# Patient Record
Sex: Female | Born: 1945 | Race: White | Hispanic: Yes | Smoking: Never smoker
Health system: Southern US, Community
[De-identification: ages and names within clinical notes are randomized; demographics above are authoritative.]

## PROBLEM LIST (undated history)

## (undated) DIAGNOSIS — E119 Type 2 diabetes mellitus without complications: Secondary | ICD-10-CM

## (undated) DIAGNOSIS — E039 Hypothyroidism, unspecified: Secondary | ICD-10-CM

## (undated) DIAGNOSIS — E785 Hyperlipidemia, unspecified: Secondary | ICD-10-CM

## (undated) HISTORY — DX: Type 2 diabetes mellitus without complications: E11.9

## (undated) HISTORY — DX: Hyperlipidemia, unspecified: E78.5

## (undated) HISTORY — PX: CYST EXCISION: SHX5701

## (undated) HISTORY — DX: Hypothyroidism, unspecified: E03.9

---

## 2005-12-17 ENCOUNTER — Ambulatory Visit: Payer: Self-pay | Admitting: Family Medicine

## 2006-01-22 ENCOUNTER — Ambulatory Visit: Payer: Self-pay | Admitting: *Deleted

## 2006-02-18 ENCOUNTER — Ambulatory Visit: Payer: Self-pay | Admitting: Family Medicine

## 2006-05-21 ENCOUNTER — Ambulatory Visit: Payer: Self-pay | Admitting: Family Medicine

## 2006-06-02 ENCOUNTER — Ambulatory Visit (HOSPITAL_COMMUNITY): Admission: RE | Admit: 2006-06-02 | Discharge: 2006-06-02 | Payer: Self-pay | Admitting: Family Medicine

## 2006-08-10 ENCOUNTER — Ambulatory Visit: Payer: Self-pay | Admitting: Family Medicine

## 2006-08-10 ENCOUNTER — Encounter (INDEPENDENT_AMBULATORY_CARE_PROVIDER_SITE_OTHER): Payer: Self-pay | Admitting: Family Medicine

## 2007-02-09 ENCOUNTER — Encounter (INDEPENDENT_AMBULATORY_CARE_PROVIDER_SITE_OTHER): Payer: Self-pay | Admitting: *Deleted

## 2008-11-13 ENCOUNTER — Ambulatory Visit: Payer: Self-pay | Admitting: Family Medicine

## 2008-11-15 ENCOUNTER — Ambulatory Visit: Payer: Self-pay | Admitting: Family Medicine

## 2008-12-06 ENCOUNTER — Ambulatory Visit: Payer: Self-pay | Admitting: Family Medicine

## 2009-02-07 ENCOUNTER — Ambulatory Visit: Payer: Self-pay | Admitting: Family Medicine

## 2009-02-07 LAB — CONVERTED CEMR LAB
Cholesterol: 140 mg/dL (ref 0–200)
Free T4: 1.21 ng/dL (ref 0.80–1.80)
HDL: 22 mg/dL — ABNORMAL LOW (ref >39)
LDL Cholesterol: 66 mg/dL (ref 0–99)
Total CHOL/HDL Ratio: 6.4
Triglycerides: 260 mg/dL — ABNORMAL HIGH (ref <150)
VLDL: 52 mg/dL — ABNORMAL HIGH (ref 0–40)

## 2009-03-01 ENCOUNTER — Ambulatory Visit: Payer: Self-pay | Admitting: Family Medicine

## 2012-02-24 ENCOUNTER — Ambulatory Visit (INDEPENDENT_AMBULATORY_CARE_PROVIDER_SITE_OTHER): Payer: Self-pay | Admitting: Family Medicine

## 2012-02-24 ENCOUNTER — Encounter: Payer: Self-pay | Admitting: Family Medicine

## 2012-02-24 VITALS — BP 120/80 | HR 76 | Temp 97.9°F | Ht <= 58 in | Wt 147.0 lb

## 2012-02-24 DIAGNOSIS — E039 Hypothyroidism, unspecified: Secondary | ICD-10-CM

## 2012-02-24 DIAGNOSIS — E785 Hyperlipidemia, unspecified: Secondary | ICD-10-CM

## 2012-02-24 DIAGNOSIS — E119 Type 2 diabetes mellitus without complications: Secondary | ICD-10-CM

## 2012-02-24 MED ORDER — METFORMIN HCL 850 MG PO TABS: 850.0000 mg | ORAL_TABLET | Freq: Two times a day (BID) | ORAL | Status: DC

## 2012-02-24 MED ORDER — LEVOTHYROXINE SODIUM 25 MCG PO TABS: 25.0000 ug | ORAL_TABLET | Freq: Every day | ORAL | Status: DC

## 2012-02-24 MED ORDER — PRAVASTATIN SODIUM 20 MG PO TABS: 20.0000 mg | ORAL_TABLET | Freq: Every day | ORAL | Status: DC

## 2012-02-24 NOTE — Patient Instructions (Signed)
It was nice to meet you. Please leave your name and number at the front desk to get a call back about the [facility].  Please make an appointment to come back in 2-3 months for labs.  Thanks [employer]. Caasi Giglia, M.D.

## 2012-02-24 NOTE — Progress Notes (Signed)
Patient ID: Brittany Stout, female   DOB: 10-Apr-1946, 66 y.o.   MRN: [id number] [facility] Truxton Stupka M. Rad Gramling, MD Phone: [phone]   Subjective: HPI: Patient is a 66 y.o. female presenting to clinic today for new patient appointment. Previously seen at [facility]. Phone interpreter used throughout history and physical. Concerns today include medication refills  DM- Followed by [facility] previously. Had labs 2 months ago and everything looked good per patient. She takes Metformin 850mg  BID. Denies any highs or lows. Checks blood sugar only when she feels bad. Tolerates Metformin well.  HLD- Takes Pravastatin daily. Last labs ok. Does not follow a diet. She is overweight. No complications with medications.  Thyroid- Losing weight. Last TSH ok. Takes medications with no difficulty.   Past Medical History  Diagnosis Date  . Diabetes mellitus   . HLD (hyperlipidemia)   . Hypothyroid    No Past Surgeries History   Social History  . Marital Status: Legally Separated    Spouse Name: N/A    Number of Children: N/A  . Years of Education: N/A   Social History Main Topics  . Smoking status: Never Smoker   . Smokeless tobacco: Not on file  . Alcohol Use: No  . Drug Use: Not on file  . Sexually Active: Not on file   Other Topics Concern  . Not on file   Social History Narrative   Lives with daughter, son-in-law and grandsons. Works at a school as a Art therapist.    Health Maintenance: Last mammogram 2 years ago. Colonoscopy 6 years ago. Takes flu shot but not this year.  ROS: Please see HPI above.  Objective: Office vital signs reviewed.  Physical Examination:  General: Awake, alert. NAD HEENT: Atraumatic, normocephalic Neck: No masses palpated. No LAD, no palpable thyroid Pulm: CTAB, no wheezes Cardio: RRR, no murmurs appreciated Abdomen: Obese, +BS, soft, nontender, nondistended Extremities: No edema Neuro: Grossly  intact  Assessment: 66 yo F with PMH of hypothyroidism, DM and HLD presenting as a new patient  Plan: See Problem List and After Visit Summary

## 2012-02-25 ENCOUNTER — Encounter: Payer: Self-pay | Admitting: Family Medicine

## 2012-02-25 DIAGNOSIS — E785 Hyperlipidemia, unspecified: Secondary | ICD-10-CM | POA: Insufficient documentation

## 2012-02-25 DIAGNOSIS — E039 Hypothyroidism, unspecified: Secondary | ICD-10-CM | POA: Insufficient documentation

## 2012-02-25 DIAGNOSIS — E119 Type 2 diabetes mellitus without complications: Secondary | ICD-10-CM | POA: Insufficient documentation

## 2012-02-25 NOTE — Assessment & Plan Note (Signed)
Continue Pravastatin. Recheck fasting labs at next visit. Encourage diet. Awaiting labs from [facility].

## 2012-02-25 NOTE — Assessment & Plan Note (Signed)
Continue current dose. Daughter concerned about her weight loss. Will trend weights and recheck TSH at next visit, or await labs from [facility]. Patient agrees with this plan.

## 2012-02-25 NOTE — Assessment & Plan Note (Signed)
Continue Metformin 850mg  BID as previously prescribed. Will recheck labs at next visit including Bmet and A1C. Encouraged her to check CBG and keep log of blood sugars

## 2012-06-23 ENCOUNTER — Ambulatory Visit (INDEPENDENT_AMBULATORY_CARE_PROVIDER_SITE_OTHER): Payer: No Typology Code available for payment source | Admitting: Family Medicine

## 2012-06-23 ENCOUNTER — Telehealth: Payer: Self-pay | Admitting: Family Medicine

## 2012-06-23 ENCOUNTER — Encounter: Payer: Self-pay | Admitting: Family Medicine

## 2012-06-23 VITALS — BP 119/78 | HR 67 | Temp 98.9°F | Ht <= 58 in | Wt 148.0 lb

## 2012-06-23 DIAGNOSIS — E039 Hypothyroidism, unspecified: Secondary | ICD-10-CM

## 2012-06-23 DIAGNOSIS — Z1239 Encounter for other screening for malignant neoplasm of breast: Secondary | ICD-10-CM

## 2012-06-23 DIAGNOSIS — Z79899 Other long term (current) drug therapy: Secondary | ICD-10-CM

## 2012-06-23 DIAGNOSIS — E119 Type 2 diabetes mellitus without complications: Secondary | ICD-10-CM

## 2012-06-23 DIAGNOSIS — M79609 Pain in unspecified limb: Secondary | ICD-10-CM

## 2012-06-23 DIAGNOSIS — M79673 Pain in unspecified foot: Secondary | ICD-10-CM | POA: Insufficient documentation

## 2012-06-23 DIAGNOSIS — E785 Hyperlipidemia, unspecified: Secondary | ICD-10-CM

## 2012-06-23 LAB — TSH: TSH: 5.691 u[IU]/mL — ABNORMAL HIGH (ref 0.350–4.500)

## 2012-06-23 LAB — LIPID PANEL
Cholesterol: 155 mg/dL (ref 0–200)
Triglycerides: 187 mg/dL — ABNORMAL HIGH (ref <150)

## 2012-06-23 LAB — COMPREHENSIVE METABOLIC PANEL
AST: 19 U/L (ref 0–37)
Calcium: 9.5 mg/dL (ref 8.4–10.5)
Glucose, Bld: 124 mg/dL — ABNORMAL HIGH (ref 70–99)
Sodium: 140 mEq/L (ref 135–145)

## 2012-06-23 LAB — POCT URINALYSIS DIPSTICK
Glucose, UA: NEGATIVE
Ketones, UA: NEGATIVE
Nitrite, UA: POSITIVE
Protein, UA: NEGATIVE
Spec Grav, UA: 1.02
Urobilinogen, UA: 0.2

## 2012-06-23 LAB — CBC
Hemoglobin: 14.6 g/dL (ref 12.0–15.0)
Platelets: 309 10*3/uL (ref 150–400)
RBC: 4.88 MIL/uL (ref 3.87–5.11)
RDW: 13.9 % (ref 11.5–15.5)

## 2012-06-23 LAB — POCT UA - MICROSCOPIC ONLY

## 2012-06-23 LAB — POCT UA - MICROALBUMIN: Creatinine, POC: 100 mg/dL

## 2012-06-23 LAB — POCT GLYCOSYLATED HEMOGLOBIN (HGB A1C): Hemoglobin A1C: 6.2

## 2012-06-23 MED ORDER — NAPROXEN 500 MG PO TABS: 500.0000 mg | ORAL_TABLET | Freq: Two times a day (BID) | ORAL | Status: DC

## 2012-06-23 MED ORDER — METFORMIN HCL 850 MG PO TABS: 850.0000 mg | ORAL_TABLET | Freq: Two times a day (BID) | ORAL | Status: DC

## 2012-06-23 MED ORDER — LEVOTHYROXINE SODIUM 25 MCG PO TABS: 25.0000 ug | ORAL_TABLET | Freq: Every day | ORAL | Status: DC

## 2012-06-23 MED ORDER — PRAVASTATIN SODIUM 20 MG PO TABS: 20.0000 mg | ORAL_TABLET | Freq: Every day | ORAL | Status: DC

## 2012-06-23 NOTE — Addendum Note (Signed)
Addended by: Hilarie Fredrickson on: 06/23/2012 11:52 AM   Modules accepted: Orders

## 2012-06-23 NOTE — Progress Notes (Signed)
Patient ID: Brittany Stout, female   DOB: 02-20-1946, 67 y.o.   MRN: [id number]  [facility] Daishawn Lauf M. Jazzman Loughmiller, MD Phone: [phone]   Subjective: HPI: Patient is a 67 y.o. female presenting to clinic today for follow up appointment. [employer] Jean Rosenthal present as interpreter throughout entire office visit. Concerns today include right foot pain.  1. Diabetes:  Readings at home: Only checks when she feels bad, 90-120 fasting. No symptomatic lows Taking medications: Metformin 850mg  BID but currently out of Rx Side effects: None ROS: denies fever, chills, dizziness, LOC, polyuria, polydipsia, numbness or tingling in extremities or chest pain. Eye exam: Long time ago  Foot exam: 3 months ago HgA1c: Pending today Microalbumin: Unsure of last exam  2. HLD: Medications: Pravastatin  Diet: Not eating much fried or fatty foods. Weight stable RUQ pain: None Muscle aches: None Last labs: Due today; currently fasting  3. Hypothyroidism: Any symptoms?: Overall feeling well Taking medications: Yes Synthroid Side effects: None Last TSH: Due today  4. Foot pain- Started in knees a long time ago, and now radiating down to the foot. Now both sides for last 6 months. On bottom of feet. When cold weather comes it make the pain worse. Doesn't take anything for pain. Feet hurt most at night time. Now wearing Nike tennis shoes because other shoes made her feel unstable. These shoes are much better.   History Reviewed: Non-smoker.  ROS: Please see HPI above.  Objective: Office vital signs reviewed. There were no vitals taken for this visit.  Physical Examination:  General: Awake, alert. NAD. Very pleasant HEENT: Atraumatic, normocephalic. MMM Pulm: CTAB, no wheezes Cardio: RRR, no murmurs appreciated Abdomen:obese, +BS, soft, nontender, nondistended Extremities: No edema. No significant TTP of knees bilaterally. + metatarsal squeeze on right. No TTP of metatarsals or  plantar fascia b/l Neuro: Grossly intact  Assessment: 67 y.o. female follow up appointment  Plan: See Problem List and After Visit Summary

## 2012-06-23 NOTE — Assessment & Plan Note (Signed)
Could be arthritic changes vs. Plantar fascitis. Will give Naproxen daily for pain. Continue to wear well cushioned shoes and f/u in 2-3 months.

## 2012-06-23 NOTE — Assessment & Plan Note (Signed)
No new symptoms. Will check TSH today and let pt know if needs to have dose changed.

## 2012-06-23 NOTE — Telephone Encounter (Signed)
Pt's UA from today shows that she has a urinary tract infection. Would you mind calling her to ask if she has any symptoms from this? Also, since she has so much bacteria in her urine it would be best for her to start an antibiotic. If she does not have any allergies, I will call in Keflex for her to take for 10 days.  Thank you! Amber M. Hairford, M.D.

## 2012-06-23 NOTE — Assessment & Plan Note (Signed)
Fasting today. Labs done. Will continue Pravastatin. Given written Rx to [facility] to [facility] to see if that would be less expensive.

## 2012-06-23 NOTE — Addendum Note (Signed)
Addended by: [place], Esley Brooking on: 06/23/2012 12:11 PM   Modules accepted: Orders

## 2012-06-23 NOTE — Patient Instructions (Signed)
We will check your labs today. We will call you if anything is abnormal, otherwise I will send you a letter.  I am prescribing an anti-inflammatory. You can take it daily for pain. Continue wearing soft shoes.  Please schedule your mammogram.  I will see you in 3 months for a follow up on your pain.   Brittany Stout M. Kristie Bracewell, M.D.

## 2012-06-23 NOTE — Assessment & Plan Note (Signed)
A1C 6.2 today. Continue Metformin daily. Will check microalbumin today, and also place Opthalmology referral although there may be a significant waitlist for this.

## 2012-06-24 MED ORDER — LEVOTHYROXINE SODIUM 50 MCG PO TABS: 50.0000 ug | ORAL_TABLET | Freq: Every day | ORAL | Status: DC

## 2012-06-24 MED ORDER — CEPHALEXIN 500 MG PO CAPS: 500.0000 mg | ORAL_CAPSULE | Freq: Two times a day (BID) | ORAL | Status: DC

## 2012-06-24 MED ORDER — LEVOTHYROXINE SODIUM 50 MCG PO TABS: 25.0000 ug | ORAL_TABLET | Freq: Every day | ORAL | Status: DC

## 2012-06-24 NOTE — Telephone Encounter (Signed)
Synthroid po daily, #90 and Keflex 500mg  po BID x 10 days #20/0R have been added to orders. Thank you

## 2012-06-24 NOTE — Telephone Encounter (Signed)
Paged Dr. Mikel Cella to make sure of dose

## 2012-06-24 NOTE — Telephone Encounter (Signed)
I called and left message on voicmail at [facility] pharmacy early this afternon

## 2012-06-24 NOTE — Telephone Encounter (Signed)
I have received other lab work for Ms. Scholten. Her TSH was high, so we should increase her Synthroid dose. [facility] If she does not want to have it filled there, I can send to [facility] instead so she does not have to come back to get a new Rx. She should NOT fill the Synthroid Rx she was given yesterday.  Also, encourage her to exercise. Her good cholesterol was too low.  If she has any questions, please let me know.  Thank you! Amber M. Hairford, M.D.

## 2012-07-01 ENCOUNTER — Telehealth: Payer: Self-pay | Admitting: *Deleted

## 2012-07-01 NOTE — Telephone Encounter (Signed)
Received phone call from Diane at [facility] and she advises  That patient never picked up Keflex  from [facility]. M ssage left on voicemail for patient to please call our office.

## 2012-07-04 ENCOUNTER — Telehealth: Payer: Self-pay | Admitting: Family Medicine

## 2012-07-04 NOTE — Telephone Encounter (Signed)
Pt called [place] back after [facility] call Pt. Pt stated that she went for med @ [facility] last week.  MJ

## 2012-07-04 NOTE — Telephone Encounter (Signed)
Marines states daughter stated she did pick up RX . Patient is now in [place].

## 2012-07-04 NOTE — Telephone Encounter (Signed)
Marines Jean Rosenthal [place] [employer] on [address] and stated the pharmacy was mistaken . She did pick up RX last week. Patient is now in [place].

## 2012-12-07 ENCOUNTER — Ambulatory Visit (HOSPITAL_COMMUNITY): Payer: Self-pay | Attending: Family Medicine

## 2013-04-28 ENCOUNTER — Telehealth: Payer: Self-pay | Admitting: *Deleted

## 2013-04-28 NOTE — Telephone Encounter (Signed)
Patient coming in on 05/01/13 for LDL.Brittany Stout, Brittany Stout

## 2013-06-26 ENCOUNTER — Telehealth: Payer: Self-pay | Admitting: *Deleted

## 2013-06-26 DIAGNOSIS — E119 Type 2 diabetes mellitus without complications: Secondary | ICD-10-CM

## 2013-06-26 NOTE — Telephone Encounter (Signed)
Pt needs an appt for a LDL.  Can you call her and help her schedule this lab appt?  Thanks Limited BrandsJazmin Hartsell,CMA

## 2013-07-03 NOTE — Telephone Encounter (Signed)
Spoke with daughter, Pt is not longer in [place].  [place]

## 2013-07-03 NOTE — Telephone Encounter (Signed)
[facility] for [facility]. [place], Maryjo RochesterJessica Dawn

## 2013-07-03 NOTE — Telephone Encounter (Signed)
Removed patient from [facility] patient panel.

## 2013-12-22 ENCOUNTER — Encounter: Payer: Self-pay | Admitting: Family Medicine

## 2013-12-22 ENCOUNTER — Ambulatory Visit (INDEPENDENT_AMBULATORY_CARE_PROVIDER_SITE_OTHER): Payer: Medicaid Other | Admitting: Family Medicine

## 2013-12-22 VITALS — BP 111/72 | HR 71 | Temp 98.5°F | Wt 147.0 lb

## 2013-12-22 DIAGNOSIS — E785 Hyperlipidemia, unspecified: Secondary | ICD-10-CM

## 2013-12-22 DIAGNOSIS — E119 Type 2 diabetes mellitus without complications: Secondary | ICD-10-CM

## 2013-12-22 DIAGNOSIS — Z0000 Encounter for general adult medical examination without abnormal findings: Secondary | ICD-10-CM

## 2013-12-22 DIAGNOSIS — E039 Hypothyroidism, unspecified: Secondary | ICD-10-CM

## 2013-12-22 LAB — COMPREHENSIVE METABOLIC PANEL
ALK PHOS: 63 U/L (ref 39–117)
ALT: 19 U/L (ref 0–35)
AST: 21 U/L (ref 0–37)
Albumin: 4.3 g/dL (ref 3.5–5.2)
BUN: 18 mg/dL (ref 6–23)
CHLORIDE: 106 meq/L (ref 96–112)
CO2: 22 meq/L (ref 19–32)
Calcium: 9.4 mg/dL (ref 8.4–10.5)
Creat: 0.83 mg/dL (ref 0.50–1.10)
GLUCOSE: 113 mg/dL — AB (ref 70–99)
Potassium: 4.2 mEq/L (ref 3.5–5.3)
Sodium: 140 mEq/L (ref 135–145)
Total Bilirubin: 0.6 mg/dL (ref 0.2–1.2)
Total Protein: 6.9 g/dL (ref 6.0–8.3)

## 2013-12-22 LAB — LIPID PANEL
CHOLESTEROL: 161 mg/dL (ref 0–200)
HDL: 18 mg/dL — ABNORMAL LOW (ref >39)
TRIGLYCERIDES: 548 mg/dL — AB (ref <150)
Total CHOL/HDL Ratio: 8.9 Ratio

## 2013-12-22 LAB — TSH: TSH: 5.094 u[IU]/mL — ABNORMAL HIGH (ref 0.350–4.500)

## 2013-12-22 LAB — POCT GLYCOSYLATED HEMOGLOBIN (HGB A1C): Hemoglobin A1C: 5.9

## 2013-12-22 MED ORDER — GABAPENTIN 300 MG PO CAPS: ORAL_CAPSULE | ORAL | Status: DC

## 2013-12-22 MED ORDER — METFORMIN HCL 850 MG PO TABS: 850.0000 mg | ORAL_TABLET | Freq: Two times a day (BID) | ORAL | Status: DC

## 2013-12-22 MED ORDER — PRAVASTATIN SODIUM 20 MG PO TABS: 20.0000 mg | ORAL_TABLET | Freq: Every day | ORAL | Status: DC

## 2013-12-22 NOTE — Patient Instructions (Signed)
Please return in 2 to 3 weeks.

## 2013-12-22 NOTE — Assessment & Plan Note (Signed)
TSH checked today.  Not currently on medication, but consider starting medication pending results.

## 2013-12-22 NOTE — Progress Notes (Signed)
Subjective:     Patient ID: Brittany MunroEsther Stout, female   DOB: 03/16/1946, 68 y.o.   MRN: [id number]  HPI Brittany Stout is a 68yo female with a history of DM, hypothyroidism, and hyperlipidemia who presents today for a wellness visit.  Her last visit was on 06/23/2012. An interpretor was used throughout the visit.  She is not taking any medications except for Metformin 850mg  BID.  She has recently returned from [place] and a pharmacy there had been giving her her prescription.  Not taking any medications for any other conditions.  She does complain of a pain in her feet bilaterally, worse on the lateral aspect of her right foot.  Pain is described as burning, throbbing, feeling like an open wound.  She has had the pain for 2years, but it has began to get significantly worse starting 3 months ago.  Has also noted blurred vision, stating she can't cut her toenails anymore because she can't see her toenails.  She would like a referral to an ophthalmologist today.  Patient has history of weakness on the left side of her body, with the latest episode occuring 3months ago and then resolving. No other complaints today.     Review of Systems  HENT: Positive for hearing loss. Negative for ear pain.   Eyes: Positive for visual disturbance. Negative for pain.  Respiratory: Negative for shortness of breath.   Cardiovascular: Negative for chest pain.  Neurological: Positive for numbness. Negative for facial asymmetry and speech difficulty.       Objective:   Physical Exam  Constitutional: She appears well-developed and well-nourished. No distress.  HENT:  Head: Normocephalic and atraumatic.  Right Ear: External ear normal.  Left Ear: External ear normal.  Eyes: Conjunctivae and EOM are normal. Pupils are equal, round, and reactive to light. Right eye exhibits no discharge. Left eye exhibits no discharge. No scleral icterus.  Pterygium noted in right eye with extension into pupil  Cardiovascular: Normal rate,  regular rhythm and normal heart sounds.  Exam reveals no gallop and no friction rub.   No murmur heard. Pulmonary/Chest: Effort normal and breath sounds normal. No respiratory distress. She has no wheezes. She has no rales.  Musculoskeletal: Normal range of motion. She exhibits no edema and no tenderness.  Muscle Strength 5/5 bilaterally for upper and lower extremities  Neurological: She is alert. She has normal strength. No cranial nerve deficit. Coordination normal.  Decreased sensation over lateral right foot.  Otherwise, sensation intact in upper and lower extremities.   Diabetes Foot Exam:  Sensation felt at all points, however decreased sensitivity of right foot compared to left.     Assessment:     Please see Problem List for Assessment.      Plan:     Please see Problem List for Plan.

## 2013-12-22 NOTE — Assessment & Plan Note (Signed)
A1C 5.9 today, down from 6.2 in January 2014.  Prescription for Metformin 850mg  BID given.  Prescription of Pravastatin 20mg  given.  Prescription of Gabapentin 300mg  given and patient instructed to take one tablet the first day, one tablet every 12 hours the second day, one tablet every 8 hours the third day, and then continue to take TID.  Patients daughter voiced understanding of plan.  Referral placed for ophthalmology due to symptoms of diabetic retinopathy.  Advised to follow up in 2-3 weeks to recheck neuropathy.

## 2013-12-22 NOTE — Assessment & Plan Note (Signed)
Lipid Panel checked today.  Prescription given for Pravastatin 20mg .

## 2013-12-26 ENCOUNTER — Telehealth: Payer: Self-pay | Admitting: *Deleted

## 2013-12-26 ENCOUNTER — Encounter: Payer: Self-pay | Admitting: *Deleted

## 2013-12-26 NOTE — Telephone Encounter (Signed)
Message copied by Tanna SavoyPROPOSITO, Jaida Basurto S on Tue Dec 26, 2013 10:44 AM ------      Message from: Araceli BoucheUMLEY, Albers N      Created: Mon Dec 25, 2013  5:17 PM       Please call and make sure she schedules a follow-up appointment.  At her last visit on 12/22/13, I asked her to schedule one for 2-3weeks to recheck her neuropathy.  I didn't see an appointment scheduled yet, but she had some lab work that came back this weekend and I'd like to make sure she has one scheduled so I go over the results with with her.            Thanks for your help!      Rumley  ------

## 2013-12-26 NOTE — Telephone Encounter (Signed)
Left message on voicemail. Keijuan Schellhase S  

## 2014-02-23 ENCOUNTER — Encounter: Payer: Self-pay | Admitting: Family Medicine

## 2014-02-23 ENCOUNTER — Ambulatory Visit (INDEPENDENT_AMBULATORY_CARE_PROVIDER_SITE_OTHER): Payer: Medicaid Other | Admitting: Family Medicine

## 2014-02-23 VITALS — BP 122/77 | HR 69 | Temp 98.3°F | Wt 147.0 lb

## 2014-02-23 DIAGNOSIS — E785 Hyperlipidemia, unspecified: Secondary | ICD-10-CM

## 2014-02-23 DIAGNOSIS — E1142 Type 2 diabetes mellitus with diabetic polyneuropathy: Secondary | ICD-10-CM

## 2014-02-23 DIAGNOSIS — Z23 Encounter for immunization: Secondary | ICD-10-CM | POA: Diagnosis not present

## 2014-02-23 MED ORDER — PRAVASTATIN SODIUM 20 MG PO TABS: 20.0000 mg | ORAL_TABLET | Freq: Every day | ORAL | Status: DC

## 2014-02-23 MED ORDER — GABAPENTIN 300 MG PO CAPS
600.0000 mg | ORAL_CAPSULE | Freq: Three times a day (TID) | ORAL | Status: DC
Start: 2014-02-23 — End: 2017-03-03

## 2014-02-23 NOTE — Patient Instructions (Signed)
Thank you so much for coming to visit us today!  Please increase Gabapentin to two tablets 3 times a day. I have sent in a refill for pravastatin.  Thanks, Dr. Caroleen Hammanumley

## 2014-02-24 NOTE — Assessment & Plan Note (Signed)
-   Last A1C 5.9 at last visit - Increased dose of Gabapentin to 600mg  TID - Instructed to follow up as needed for pain.  - Return in three months to recheck A1C. Consider titrating off of medication if A1C continues to be well controlled

## 2014-02-24 NOTE — Assessment & Plan Note (Signed)
-   Refill of Pravastatin sent to pharmacy - Lipid panel at last visit showed Cholesterol of 161, TG of 548, HDL of 18 - ASCVD risk of 16.1%13.9%, however important to note calculation only studied for ages up to 67yo - Discussed importance of taking statin medication

## 2014-02-24 NOTE — Progress Notes (Signed)
Subjective:     Patient ID: Brittany Stout, female   DOB: 03/16/1946, 68 y.o.   MRN: [id number]  HPI Brittany Stout is a 68yo female presenting today to follow up her diabetic neuropathy.  Visit occurred with help of Spanish translator. # Diabetic Neuropathy - States pain is much improved and is now limited to right foot. - Gabapentin helps with pain and improves pain in right foot.  -Takes Gabapentin 300mg  TID - Describes pain in foot as feeling "loose" - States she is taking her diabetes medication  - Does not check her blood sugars - No refills of medications needed at this visit. - States she was first diagnosed with Diabetes thirteen years ago.  #Hyperlipidemia - States she has not been taking her Pravastatin because she ran out of refills  No other concerns today.   Review of Systems  All other systems reviewed and are negative.      Objective:   Physical Exam  Constitutional: She appears well-developed and well-nourished. No distress.  Cardiovascular: Normal rate, regular rhythm, normal heart sounds and intact distal pulses.  Exam reveals no gallop and no friction rub.   No murmur heard. Pulmonary/Chest: Effort normal and breath sounds normal. No respiratory distress. She has no wheezes. She has no rales.  Abdominal: Soft. Bowel sounds are normal. She exhibits no distension and no mass. There is no tenderness. There is no rebound and no guarding.  Neurological:  No decreased sensation noted in feet.  Skin: Skin is warm and dry. No rash noted. She is not diaphoretic. No erythema.  No sores or ulcers noted on foot exam  Psychiatric: She has a normal mood and affect. Her behavior is normal.       Assessment:     Please see Problem List for Assessment.     Plan:     Please see Problem List for Plan.

## 2014-03-26 ENCOUNTER — Encounter: Payer: Self-pay | Admitting: Family Medicine

## 2015-01-01 ENCOUNTER — Ambulatory Visit: Payer: Medicaid Other | Admitting: Family Medicine

## 2015-02-15 ENCOUNTER — Other Ambulatory Visit: Payer: Self-pay | Admitting: Internal Medicine

## 2015-02-15 DIAGNOSIS — E2839 Other primary ovarian failure: Secondary | ICD-10-CM

## 2015-03-12 ENCOUNTER — Other Ambulatory Visit: Payer: Self-pay

## 2015-03-12 DIAGNOSIS — Z1231 Encounter for screening mammogram for malignant neoplasm of breast: Secondary | ICD-10-CM

## 2015-03-29 ENCOUNTER — Ambulatory Visit
Admission: RE | Admit: 2015-03-29 | Discharge: 2015-03-29 | Disposition: A | Discharge status: Home / Self Care | Payer: Medicaid Other | Source: Ambulatory Visit

## 2015-03-29 DIAGNOSIS — Z1231 Encounter for screening mammogram for malignant neoplasm of breast: Secondary | ICD-10-CM

## 2015-04-09 ENCOUNTER — Other Ambulatory Visit: Payer: Self-pay | Admitting: Internal Medicine

## 2015-04-09 DIAGNOSIS — R928 Other abnormal and inconclusive findings on diagnostic imaging of breast: Secondary | ICD-10-CM

## 2016-01-09 NOTE — Progress Notes (Signed)
Subjective:     Patient ID: Brittany Stout, female   DOB: 1945-06-12, 70 y.o.   MRN: [id number]  HPI Mrs. Brittany Stout is a 70yo female presenting today for diabetes follow up.Visit conducted with the aid of Spanish interpreter. - Last office visit 02/2014. Was taking Metformin 850mg  twice daily. A1C 5.9 in 11/2013. -Reports she is still taking metformin 850 mg twice daily. -Denies any numbness in feet, blurred vision -Main concern today is with occasional whole-body pain. Reports that pain is literally over entire body, encompassing her bilateral arms, legs, back, abdomen, head. Pain is not currently present. -When pain is present last for 2-3 days; this occurs several times per week. -Does not take medication for pain -Last CMP in July 2015. Last thyroid check July 2015. -Nonsmoker  Review of Systems Per HPI. Other systems negative.    Objective:   Physical Exam  Constitutional: She appears well-developed and well-nourished. No distress.  Cardiovascular: Normal rate and regular rhythm.   No murmur heard. Pulmonary/Chest: Effort normal. No respiratory distress. She has no wheezes.  Abdominal: Soft. She exhibits no distension. There is no tenderness.  Neurological:  Sensation intact over feet bilaterally  Skin: No rash noted.  No callouses noted.  Psychiatric: She has a normal mood and affect. Her behavior is normal.      Assessment and Plan:     DM (diabetes mellitus) -A1c increased to 6.4 today, up from 5.9 at last check in 2015. -Will increase metformin to 1000 mg twice daily dosing. May complete 850 mg tablets. -Will check CMP today. -Return in 3 months to recheck A1c.  Hypothyroidism -Currently on levothyroxine. -Will check TSH   Diffuse pain -Unsure of etiology since she is not currently in pain to examine. History of full-body pain including every part of her body have normal. -Will prescribe naproxen 500 mg twice a day as needed for pain -Will check CMP

## 2016-01-10 ENCOUNTER — Encounter: Payer: Self-pay | Admitting: Family Medicine

## 2016-01-10 ENCOUNTER — Ambulatory Visit (INDEPENDENT_AMBULATORY_CARE_PROVIDER_SITE_OTHER): Payer: Medicaid Other | Admitting: Family Medicine

## 2016-01-10 VITALS — BP 103/70 | HR 82 | Temp 97.7°F | Ht <= 58 in | Wt 147.6 lb

## 2016-01-10 DIAGNOSIS — R69 Illness, unspecified: Secondary | ICD-10-CM

## 2016-01-10 DIAGNOSIS — E1142 Type 2 diabetes mellitus with diabetic polyneuropathy: Secondary | ICD-10-CM | POA: Diagnosis not present

## 2016-01-10 DIAGNOSIS — R52 Pain, unspecified: Secondary | ICD-10-CM

## 2016-01-10 LAB — POCT GLYCOSYLATED HEMOGLOBIN (HGB A1C): Hemoglobin A1C: 6.4

## 2016-01-10 MED ORDER — METFORMIN HCL 1000 MG PO TABS: 1000.0000 mg | ORAL_TABLET | Freq: Two times a day (BID) | ORAL | 3 refills | Status: DC

## 2016-01-10 MED ORDER — NAPROXEN 500 MG PO TABS: 500.0000 mg | ORAL_TABLET | Freq: Two times a day (BID) | ORAL | 2 refills | Status: DC | PRN

## 2016-01-10 NOTE — Patient Instructions (Addendum)
Thank you so much for coming to visit today! - We will check several labs today. We will mail you a letter with the results. - Please increase your Metformin to 1000mg . You may complete the bottle of Metformin you currently have prior to starting. - I have given you a prescription for Naproxen to take twice a day as needed for pain.  Dr. Caroleen Hammanumley

## 2016-01-10 NOTE — Assessment & Plan Note (Signed)
-  Currently on levothyroxine. -Will check TSH

## 2016-01-10 NOTE — Assessment & Plan Note (Addendum)
-  A1c increased to 6.4 today, up from 5.9 at last check in 2015. -Will increase metformin to 1000 mg twice daily dosing. May complete 850 mg tablets. -Will check CMP today. -Return in 3 months to recheck A1c.

## 2016-01-11 LAB — COMPLETE METABOLIC PANEL WITH GFR
ALBUMIN: 4.2 g/dL (ref 3.6–5.1)
ALK PHOS: 56 U/L (ref 33–130)
ALT: 24 U/L (ref 6–29)
AST: 28 U/L (ref 10–35)
BILIRUBIN TOTAL: 0.5 mg/dL (ref 0.2–1.2)
BUN: 11 mg/dL (ref 7–25)
CO2: 22 mmol/L (ref 20–31)
Calcium: 9.4 mg/dL (ref 8.6–10.4)
Chloride: 104 mmol/L (ref 98–110)
Creat: 0.76 mg/dL (ref 0.50–0.99)
GFR, EST NON AFRICAN AMERICAN: 81 mL/min (ref >=60)
GLUCOSE: 88 mg/dL (ref 65–99)
POTASSIUM: 4.1 mmol/L (ref 3.5–5.3)
SODIUM: 139 mmol/L (ref 135–146)
Total Protein: 7.1 g/dL (ref 6.1–8.1)

## 2016-01-11 LAB — TSH: TSH: 2.95 mIU/L

## 2016-01-13 ENCOUNTER — Encounter: Payer: Self-pay | Admitting: Family Medicine

## 2016-01-29 ENCOUNTER — Other Ambulatory Visit: Payer: Self-pay | Admitting: Family Medicine

## 2016-01-29 DIAGNOSIS — Z1231 Encounter for screening mammogram for malignant neoplasm of breast: Secondary | ICD-10-CM

## 2016-04-07 ENCOUNTER — Ambulatory Visit
Admission: RE | Admit: 2016-04-07 | Discharge: 2016-04-07 | Disposition: A | Discharge status: Home / Self Care | Payer: Medicaid Other | Source: Ambulatory Visit | Attending: Family Medicine | Admitting: Family Medicine

## 2016-04-07 DIAGNOSIS — Z1231 Encounter for screening mammogram for malignant neoplasm of breast: Secondary | ICD-10-CM

## 2016-04-29 ENCOUNTER — Ambulatory Visit (INDEPENDENT_AMBULATORY_CARE_PROVIDER_SITE_OTHER): Payer: Medicaid Other | Admitting: Family Medicine

## 2016-04-29 ENCOUNTER — Encounter: Payer: Self-pay | Admitting: Family Medicine

## 2016-04-29 VITALS — BP 110/72 | HR 73 | Temp 98.3°F | Ht <= 58 in | Wt 152.6 lb

## 2016-04-29 DIAGNOSIS — R413 Other amnesia: Secondary | ICD-10-CM | POA: Diagnosis not present

## 2016-04-29 DIAGNOSIS — E1142 Type 2 diabetes mellitus with diabetic polyneuropathy: Secondary | ICD-10-CM | POA: Diagnosis present

## 2016-04-29 LAB — CBC
HEMATOCRIT: 41 % (ref 35.0–45.0)
HEMOGLOBIN: 13.6 g/dL (ref 11.7–15.5)
MCH: 29.6 pg (ref 27.0–33.0)
MCHC: 33.2 g/dL (ref 32.0–36.0)
MCV: 89.1 fL (ref 80.0–100.0)
MPV: 10.8 fL (ref 7.5–12.5)
Platelets: 283 10*3/uL (ref 140–400)
RBC: 4.6 MIL/uL (ref 3.80–5.10)
RDW: 13.7 % (ref 11.0–15.0)
WBC: 8.4 10*3/uL (ref 3.8–10.8)

## 2016-04-29 LAB — BASIC METABOLIC PANEL
BUN: 15 mg/dL (ref 7–25)
CALCIUM: 9.1 mg/dL (ref 8.6–10.4)
CO2: 22 mmol/L (ref 20–31)
Chloride: 104 mmol/L (ref 98–110)
Creat: 0.7 mg/dL (ref 0.50–0.99)
GLUCOSE: 84 mg/dL (ref 65–99)
Potassium: 4.1 mmol/L (ref 3.5–5.3)
SODIUM: 140 mmol/L (ref 135–146)

## 2016-04-29 LAB — LIPID PANEL
CHOL/HDL RATIO: 10.5 ratio — AB (ref <5.0)
CHOLESTEROL: 178 mg/dL (ref <200)
HDL: 17 mg/dL — ABNORMAL LOW (ref >50)
TRIGLYCERIDES: 446 mg/dL — AB (ref <150)

## 2016-04-29 LAB — VITAMIN B12: VITAMIN B 12: 281 pg/mL (ref 200–1100)

## 2016-04-29 LAB — TSH: TSH: 5.1 m[IU]/L — AB

## 2016-04-29 LAB — POCT GLYCOSYLATED HEMOGLOBIN (HGB A1C): HEMOGLOBIN A1C: 6

## 2016-04-29 NOTE — Patient Instructions (Signed)
Thank you so much for coming to visit today! We will check some labs. Please return if memory loss worsens. Please return in 6 months to recheck your A1C. Keep taking your Metformin. It might help to set an alarm on your phone.  Dr. Caroleen Hammanumley

## 2016-04-30 NOTE — Progress Notes (Signed)
Subjective:     Patient ID: Brittany Stout, female   DOB: [birth year], 70 y.o.   MRN: [id number]  HPI Brittany Stout Is a 70 year old female presenting today for diabetes management. Visit conducted with the aid of Spanish interpreter.  Reports she is still taking metformin 1000 mg twice a day. Does not check blood sugars. Last A1c 6.4 in August 2017. Reports well controlled diet. Reports she does a lot of walking.  Also of note, she is concerned about worsening memory. Reports she occasionally forgets to take her medicines and forgets where she puts her shoes/sweaters/etc. Does not notice any limitations to function to to memory loss. Family reports they did not notice any difference in her memory or behavior.  Nonsmoker  Review of Systems Per HPI     Objective:   Physical Exam  Constitutional: She is oriented to person, place, and time. She appears well-developed and well-nourished. No distress.  HENT:  Head: Normocephalic and atraumatic.  Eyes: EOM are normal. Pupils are equal, round, and reactive to light.  Neck: Normal range of motion. No thyromegaly present.  Cardiovascular: Normal rate and regular rhythm.   No murmur heard. Pulses intact  Pulmonary/Chest: Effort normal. No respiratory distress. She has no wheezes.  Abdominal: Soft. She exhibits no distension. There is no tenderness.  Musculoskeletal: She exhibits no edema.  Muscle strength 5 out of 5 in upper and lower extremities  Lymphadenopathy:    She has no cervical adenopathy.  Neurological: She is alert and oriented to person, place, and time. She has normal reflexes. No cranial nerve deficit.  Sensation intact. Short-term memory intact. Unable to complete clock draw, which is baseline per daughter.  Skin: No rash noted.  Psychiatric: She has a normal mood and affect. Her behavior is normal.      Assessment and Plan:     DM (diabetes mellitus) A1c 6 today. Continue metformin 1000 g twice a day. Follow-up in 6  months.  Memory Loss Neurological exam normal. Does not seem to be interfering with daily function. Discussed the effects sleep, diet, and workload has on memory. Will check BMP, CBC, TSH, vitamin B12. Follow-up if worsens.

## 2016-04-30 NOTE — Assessment & Plan Note (Signed)
A1c 6 today. Continue metformin 1000 g twice a day. Follow-up in 6 months.

## 2016-05-06 ENCOUNTER — Ambulatory Visit (HOSPITAL_COMMUNITY)
Admission: EM | Admit: 2016-05-06 | Discharge: 2016-05-06 | Disposition: A | Discharge status: Home / Self Care | Payer: Medicaid Other | Attending: Family Medicine | Admitting: Family Medicine

## 2016-05-06 ENCOUNTER — Ambulatory Visit: Payer: Medicaid Other | Admitting: Family Medicine

## 2016-05-06 ENCOUNTER — Encounter (HOSPITAL_COMMUNITY): Payer: Self-pay | Admitting: Emergency Medicine

## 2016-05-06 DIAGNOSIS — N390 Urinary tract infection, site not specified: Secondary | ICD-10-CM | POA: Diagnosis not present

## 2016-05-06 DIAGNOSIS — R1084 Generalized abdominal pain: Secondary | ICD-10-CM

## 2016-05-06 DIAGNOSIS — R319 Hematuria, unspecified: Secondary | ICD-10-CM

## 2016-05-06 DIAGNOSIS — R110 Nausea: Secondary | ICD-10-CM

## 2016-05-06 LAB — GLUCOSE, CAPILLARY: Glucose-Capillary: 90 mg/dL (ref 65–99)

## 2016-05-06 LAB — POCT URINALYSIS DIP (DEVICE)
BILIRUBIN URINE: NEGATIVE
GLUCOSE, UA: NEGATIVE mg/dL
Ketones, ur: NEGATIVE mg/dL
NITRITE: NEGATIVE
Protein, ur: NEGATIVE mg/dL
Specific Gravity, Urine: 1.01 (ref 1.005–1.030)
UROBILINOGEN UA: 0.2 mg/dL (ref 0.0–1.0)
pH: 7 (ref 5.0–8.0)

## 2016-05-06 MED ORDER — NITROFURANTOIN MONOHYD MACRO 100 MG PO CAPS: 100.0000 mg | ORAL_CAPSULE | Freq: Two times a day (BID) | ORAL | 0 refills | Status: DC

## 2016-05-06 MED ORDER — NITROFURANTOIN MONOHYD MACRO 100 MG PO CAPS
100.0000 mg | ORAL_CAPSULE | Freq: Two times a day (BID) | ORAL | 0 refills | Status: DC
Start: 2016-05-06 — End: 2016-05-06

## 2016-05-06 MED ORDER — ONDANSETRON 4 MG PO TBDP: 4.0000 mg | ORAL_TABLET | Freq: Three times a day (TID) | ORAL | 0 refills | Status: DC | PRN

## 2016-05-06 NOTE — ED Provider Notes (Signed)
CSN: [id number]     Arrival date & time 05/06/16  1257 History   First MD Initiated Contact with Patient 05/06/16 1343     Chief Complaint  Patient presents with  . Abdominal Pain   (Consider location/radiation/quality/duration/timing/severity/associated sxs/prior Treatment) HPI  Past Medical History:  Diagnosis Date  . Diabetes mellitus (HCC)   . HLD (hyperlipidemia)   . Hypothyroid    Past Surgical History:  Procedure Laterality Date  . CYST EXCISION     Face   History reviewed. No pertinent family history. Social History  Substance Use Topics  . Smoking status: Never Smoker  . Smokeless tobacco: Not on file  . Alcohol use No   OB History    Obstetric Comments   6 vaginal births     Review of Systems  Allergies  Patient has no known allergies.  Home Medications   Prior to Admission medications   Medication Sig Start Date End Date Taking? Authorizing Provider  gabapentin (NEURONTIN) 300 MG capsule Take 2 capsules (600 mg total) by mouth 3 (three) times daily. Take two tablets three times a day. 02/23/14  Yes [place] N Rumley, DO  levothyroxine (SYNTHROID, LEVOTHROID) 50 MCG tablet Take 1 tablet (50 mcg total) by mouth daily. 06/24/12  Yes Amber Nydia BoutonM Hairford, MD  metFORMIN (GLUCOPHAGE) 1000 MG tablet Take 1 tablet (1,000 mg total) by mouth 2 (two) times daily with a meal. 01/10/16  Yes [place]enn[place] N Rumley, DO  pravastatin (PRAVACHOL) 20 MG tablet Take 1 tablet (20 mg total) by mouth daily. 02/23/14  Yes [place] N Rumley, DO  naproxen (NAPROSYN) 500 MG tablet Take 1 tablet (500 mg total) by mouth 2 (two) times daily as needed. 01/10/16   [place] N Rumley, DO  nitrofurantoin, macrocrystal-monohydrate, (MACROBID) 100 MG capsule Take 1 capsule (100 mg total) by mouth 2 (two) times daily. 05/06/16   Deatra CanterWilliam J Oxford, FNP  ondansetron (ZOFRAN ODT) 4 MG disintegrating tablet Take 1 tablet (4 mg total) by mouth every 8 (eight) hours as needed for nausea or vomiting. 05/06/16   Deatra CanterWilliam  J Oxford, FNP   Meds Ordered and Administered this Visit  Medications - No data to display  BP 132/91 (BP Location: Left Arm)   Pulse 87   Temp 98.4 F (36.9 C) (Oral)   Resp 18   SpO2 96%  No data found.   Physical Exam  Urgent Care Course   Clinical Course     Procedures (including critical care time)  Labs Review Labs Reviewed  POCT URINALYSIS DIP (DEVICE) - Abnormal; Notable for the following:       Result Value   Hgb urine dipstick TRACE (*)    Leukocytes, UA TRACE (*)    All other components within normal limits  GLUCOSE, CAPILLARY    Imaging Review No results found.   Visual Acuity Review  Right Eye Distance:   Left Eye Distance:   Bilateral Distance:    Right Eye Near:   Left Eye Near:    Bilateral Near:         MDM   1. Urinary tract infection with hematuria, site unspecified   2. Nausea   3. Generalized abdominal pain    macrobid 100mg  one po bid x 10 days #20 zofran odt 4mg  one po tid prn #21  Push po fluids, rest, tylenol and motrin otc prn as directed for fever, arthralgias, and myalgias.  Follow up prn if sx's continue or persist.    Deatra CanterWilliam J Oxford, FNP 05/06/16  1438  

## 2016-05-06 NOTE — ED Triage Notes (Signed)
[facility] with a complaint of abdominal pain with N/V that started last night.

## 2016-06-26 ENCOUNTER — Telehealth: Payer: Self-pay

## 2016-06-26 NOTE — Telephone Encounter (Signed)
-----   Message from [place] N Rumley, [place] sent at 06/12/2016  3:37 PM EST ----- Please have patient return for visit to repeat thyroid testing in 2-3 weeks. TSH was elevated, but consistent with prior--if it does not return to normal, we may need to adjust dose of Synthroid. Thanks! [facility]

## 2016-06-26 NOTE — Telephone Encounter (Signed)
Left message with daughter to have pt call and make an appt to have some blood work done. Please see note below.  Sunday SpillersSharon T Saunders, CMA

## 2017-03-03 ENCOUNTER — Encounter: Payer: Self-pay | Admitting: Internal Medicine

## 2017-03-03 ENCOUNTER — Ambulatory Visit (INDEPENDENT_AMBULATORY_CARE_PROVIDER_SITE_OTHER): Payer: Medicare Other | Admitting: Internal Medicine

## 2017-03-03 VITALS — BP 120/72 | HR 75 | Temp 98.2°F | Ht <= 58 in | Wt 146.8 lb

## 2017-03-03 DIAGNOSIS — M25562 Pain in left knee: Secondary | ICD-10-CM

## 2017-03-03 DIAGNOSIS — E1142 Type 2 diabetes mellitus with diabetic polyneuropathy: Secondary | ICD-10-CM

## 2017-03-03 DIAGNOSIS — M25561 Pain in right knee: Secondary | ICD-10-CM

## 2017-03-03 DIAGNOSIS — G8929 Other chronic pain: Secondary | ICD-10-CM | POA: Diagnosis not present

## 2017-03-03 LAB — POCT GLYCOSYLATED HEMOGLOBIN (HGB A1C): Hemoglobin A1C: 6.1

## 2017-03-03 MED ORDER — MELOXICAM 15 MG PO TABS: 15.0000 mg | ORAL_TABLET | Freq: Every day | ORAL | 2 refills | Status: DC | PRN

## 2017-03-03 NOTE — Progress Notes (Signed)
   Subjective:    Brittany Stout - 71 y.o. female MRN [id number]  Date of birth: 04-25-1946  HPI  Brittany Stout is here for T2DM follow up.   Diabetes mellitus, Type 2 Disease Monitoring Blood Sugar Ranges: does not measure  Polyuria: no Visual problems: no   Urine Microalbumin Normal previously. Checked today.   Last A1C: 6.0 (Dec 2017)   Medication: Metformin 1000 mg BID  Medication Compliance: yes  Medication Side Effects Hypoglycemia: no   Preventitive Health Care Eye Exam: Needs Foot Exam: Performed today   Knee Pain: Currently very minimal. Present bilaterally for years. Anterior location of pain. Pain comes and goes. Prolonged sitting, going up and down stairs, walking on hills exacerbates pain. Has not tried any medications. Nothing seems to make it better. No known history of arthritis. No known injuries to the knees. No popping, locking, clicking or giving way. No edema of the knees. No pain radiating down legs or numbness/tingling.    Health Maintenance Due  Topic Date Due  . Hepatitis C Screening  [birth year]  . FOOT EXAM  03/16/1957  . OPHTHALMOLOGY EXAM  03/16/1957  . TETANUS/TDAP  03/16/1966  . COLONOSCOPY  03/16/1997  . DEXA SCAN  03/16/2012  . PNA vac Low Risk Adult (1 of 2 - PCV13) 03/16/2012  . INFLUENZA VACCINE  12/23/2016    -  reports that she has never smoked. She has never used smokeless tobacco. - Review of Systems: Per HPI. - Past Medical History: Patient Active Problem List   Diagnosis Date Noted  . Bilateral chronic knee pain 03/05/2017  . Foot pain 06/23/2012  . HLD (hyperlipidemia) 02/25/2012  . DM (diabetes mellitus) (HCC) 02/25/2012  . Hypothyroidism 02/25/2012   - Medications: reviewed and updated   Objective:   Physical Exam BP 120/72   Pulse 75   Temp 98.2 F (36.8 C) (Oral)   Ht  (1.422 m)   Wt 146 lb 12.8 oz (66.6 kg)   SpO2 96%   BMI 32.91  kg/m  Gen: NAD, alert, cooperative with exam, well-appearing CV: RRR, good S1/S2, no murmur, no edema, capillary refill brisk  Resp: CTABL, no wheezes, non-labored Knee: No effusions, erythema, or increased warmth of knees bilaterally. No obvious deformity. No TTP over patella or medial/lateral joint lines. ROM intact bilaterally. Crepitus with passive flexion and extension at knees. Negative anterior drawer bilaterally. No pain with valgus or varus stress testing.   Diabetic Foot Check -  Appearance - no lesions, ulcers or calluses Skin - no unusual pallor or redness Monofilament testing - normal bilaterally  Right - Great toe, medial, central, lateral ball and posterior foot intact Left - Great toe, medial, central, lateral ball and posterior foot intact     Assessment & Plan:   DM (diabetes mellitus) A1c essentially unchanged at 6.1 today. Continue Metformin 1000 mg BID. Discussed need for diabetic eye exam.   Bilateral chronic knee pain Suspect OA given chronic waxing and waning bilateral knee pain and age of patient. No signs of infectious etiology and less likely given prolonged history of pain. No evidence of ligament laxity or meniscal damage on exam. Will obtain x-rays of knees bilaterally as none performed in the past on record. Recommended NSAIDs and could consider corticosteroid injections for acute exacerbation of pain.   Return for annual exam and for health maintenance topics.   Marcy Siren, D.O. 03/05/2017, 10:21 AM PGY-3, [facility]

## 2017-03-03 NOTE — Patient Instructions (Addendum)
Please go for an eye exam for your diabetes. We will call you with the lab results from today.   I suspect your knee pain is related to arthritis. I have ordered x-rays. Please go to [facility] to have these done. I have prescribed an anti-inflammatory medication to help with pain. You can take this as needed. If you have significant worsening of your pain, you can return for knee injections.

## 2017-03-04 LAB — MICROALBUMIN / CREATININE URINE RATIO
Creatinine, Urine: 87.1 mg/dL
MICROALB/CREAT RATIO: 27.3 mg/g{creat} (ref 0.0–30.0)
Microalbumin, Urine: 23.8 ug/mL

## 2017-03-05 DIAGNOSIS — M25562 Pain in left knee: Secondary | ICD-10-CM

## 2017-03-05 DIAGNOSIS — G8929 Other chronic pain: Secondary | ICD-10-CM | POA: Insufficient documentation

## 2017-03-05 DIAGNOSIS — M25561 Pain in right knee: Secondary | ICD-10-CM

## 2017-03-05 NOTE — Assessment & Plan Note (Signed)
Suspect OA given chronic waxing and waning bilateral knee pain and age of patient. No signs of infectious etiology and less likely given prolonged history of pain. No evidence of ligament laxity or meniscal damage on exam. Will obtain x-rays of knees bilaterally as none performed in the past on record. Recommended NSAIDs and could consider corticosteroid injections for acute exacerbation of pain.

## 2017-03-05 NOTE — Assessment & Plan Note (Signed)
A1c essentially unchanged at 6.1 today. Continue Metformin 1000 mg BID. Discussed need for diabetic eye exam.

## 2017-03-15 ENCOUNTER — Ambulatory Visit
Admission: RE | Admit: 2017-03-15 | Discharge: 2017-03-15 | Disposition: A | Discharge status: Home / Self Care | Payer: Medicare Other | Source: Ambulatory Visit | Attending: Family Medicine | Admitting: Family Medicine

## 2017-03-15 DIAGNOSIS — M25561 Pain in right knee: Principal | ICD-10-CM

## 2017-03-15 DIAGNOSIS — M25562 Pain in left knee: Principal | ICD-10-CM

## 2017-03-15 DIAGNOSIS — G8929 Other chronic pain: Secondary | ICD-10-CM

## 2017-03-17 ENCOUNTER — Encounter: Payer: Self-pay | Admitting: Internal Medicine

## 2017-03-17 ENCOUNTER — Ambulatory Visit (INDEPENDENT_AMBULATORY_CARE_PROVIDER_SITE_OTHER): Payer: Medicare Other | Admitting: Internal Medicine

## 2017-03-17 VITALS — BP 120/82 | HR 67 | Temp 97.9°F | Ht <= 58 in | Wt 147.4 lb

## 2017-03-17 DIAGNOSIS — Z23 Encounter for immunization: Secondary | ICD-10-CM

## 2017-03-17 DIAGNOSIS — Z1239 Encounter for other screening for malignant neoplasm of breast: Secondary | ICD-10-CM

## 2017-03-17 DIAGNOSIS — Z20828 Contact with and (suspected) exposure to other viral communicable diseases: Secondary | ICD-10-CM

## 2017-03-17 DIAGNOSIS — E2839 Other primary ovarian failure: Secondary | ICD-10-CM | POA: Diagnosis not present

## 2017-03-17 DIAGNOSIS — Z1231 Encounter for screening mammogram for malignant neoplasm of breast: Secondary | ICD-10-CM | POA: Diagnosis not present

## 2017-03-17 DIAGNOSIS — Z0000 Encounter for general adult medical examination without abnormal findings: Secondary | ICD-10-CM

## 2017-03-17 MED ORDER — METFORMIN HCL 1000 MG PO TABS: 1000.0000 mg | ORAL_TABLET | Freq: Two times a day (BID) | ORAL | 3 refills | Status: DC

## 2017-03-17 NOTE — Patient Instructions (Signed)
Please call a GI doctor for a colonoscopy, a screening test for colon cancer. We have ordered a Dexa scan which screens for osteoporosis, or weakening of bones that sometimes occurs with age. I have also ordered your mammogram and you can go to the breast center to have this done.   Colonoscopia en los adultos (Colonoscopy, Adult) Una colonoscopia es un examen que se realiza para examinar el intestino grueso. Se hace para comprobar si hay problemas, por ejemplo:  Bultos (tumores).  Crecimientos (plipos).  Hinchazn (inflamacin).  Una hemorragia. ANTES DEL PROCEDIMIENTO Comida y bebida Siga las indicaciones del mdico respecto de las comidas y las bebidas. Estas indicaciones pueden incluir lo siguiente:  [employer] del procedimiento, siga una dieta con bajo contenido de [place]. ? No coma frutos secos. ? No coma semillas. ? No coma frutas disecadas. ? No coma frutas crudas. ? No coma verduras.  Siga una dieta de lquidos transparentes durante 1 a 3das antes del procedimiento. No beba lquidos con colorante rojo o morado. Beba solamente lquidos transparentes, por ejemplo: ? Caldos o sopas transparentes. ? Caf negro o t. ? Jugos transparentes. ? Refrescos o bebidas deportivas transparentes. ? Gelatina. ? Helados de agua.  El da del procedimiento, no coma ni beba nada durante las 2horas previas al procedimiento. Preparado intestinal Si le recetaron un preparado intestinal por va oral:  Tmelo como se lo haya indicado el mdico. A partir del da previo al procedimiento, tendr que tomar una gran cantidad de lquido. El lquido lo har defecar hasta que la materia fecal sea casi transparente o de color verdoso claro.  Si la piel o la zona anal se le irritan debido a Technical sales engineer, Scientist, product/process development lo siguiente: ? Limpie la zona con toallitas que contengan productos medicinales, por ejemplo, toallitas hmedas para adultos con aloe y vitaminaE. ? Aplquese un producto en la piel  que suavice la zona, como vaselina.  Si vomita mientras toma el preparado intestinal, descanse durante un mximo de . Luego comience a tomar el preparado nuevamente. Si sigue vomitando y no puede tomar el preparado intestinal sin vomitar, llame al mdico. Instrucciones generales  Consulte al mdico si debe cambiar o suspender sus medicamentos habituales. Esto es importante si toma medicamentos para la diabetes o anticoagulantes.  Haga planes para que una persona lo lleve a su casa desde el hospital o la clnica. PROCEDIMIENTO  Pueden colocarle una va intravenosa (IV) en una de las venas.  Recibir un medicamento como ayuda para relajarse (sedante).  Para reducir el riesgo de infecciones: ? Los mdicos se lavarn las manos. ? Le lavarn la zona anal con jabn.  Le pedirn que se recueste de costado con las rodillas flexionadas.  El mdico tendr listo un tubo [place], delgado y flexible. El tubo tendr Posey Boyer y Nettie Elm en el extremo.  Le colocarn el tubo en el ano.  El tubo se introducir suavemente en el intestino grueso.  Le pondrn aire en el intestino grueso para mantenerlo abierto. Es posible que sienta presin o clicos.  La cmara se usar para Stage manager.  Pueden extraerle Carlynn Herald de tejido del cuerpo para estudiarla con un microscopio (biopsia). Si se detecta la presencia de posibles problemas, se enviar el tejido a un laboratorio para ser [employer].  Si se encuentran pequeos crecimientos, el mdico puede extirparlos y analizarlos para Landscape architect presencia de cncer.  Lentamente se retirar Mudlogger en el ano. Este procedimiento puede variar segn el  mdico y el hospital. DESPUS DEL PROCEDIMIENTO  El mdico lo controlar con frecuencia hasta que hayan desaparecido los efectos de los medicamentos administrados.  No conduzca durante 24horas despus del procedimiento.  Es posible que encuentre una pequea cantidad de  sangre en la materia fecal.  Puede eliminar gases.  Puede sentir clicos o meteorismo leves en el abdomen.  Depende de usted [employer] Services Corporationretirar los resultados del procedimiento. Pregntele al mdico o consulte en el departamento que realiza el procedimiento cundo CIT Groupestarn los resultados.  Esta informacin no tiene Theme park managercomo fin reemplazar el consejo del mdico. Asegrese de hacerle al mdico cualquier pregunta que tenga. Document Released: 08/26/2010 Document Revised: 05/16/2013 Document Reviewed: 07/23/2015 Elsevier Interactive Patient Education  2017 [employer]

## 2017-03-17 NOTE — Progress Notes (Signed)
   Subjective:    Brittany Stout - 71 y.o. female MRN [id number]  Date of birth: 1946/01/11  HPI  Brittany Stout is here for annual exam. Reports no concerns.    ROS:  Patient reports no  vision/ hearing changes,anorexia, weight change, fever ,adenopathy, persistant / recurrent hoarseness, swallowing issues, chest pain, edema,persistant / recurrent cough, hemoptysis, dyspnea(rest, exertional, paroxysmal nocturnal), gastrointestinal  bleeding (melena, rectal bleeding), abdominal pain, excessive heart burn, GU symptoms(dysuria, hematuria, pyuria, voiding/incontinence  Issues) syncope, focal weakness, severe memory loss, concerning skin lesions, depression, anxiety, abnormal bruising/bleeding, major joint swelling, breast masses or abnormal vaginal bleeding.    Health Maintenance Due  Topic Date Due  . Hepatitis C Screening  1946/01/11  . OPHTHALMOLOGY EXAM  03/16/1957  . TETANUS/TDAP  03/16/1966  . COLONOSCOPY  03/16/1997  . DEXA SCAN  03/16/2012    -  reports that she has never smoked. She has never used smokeless tobacco. - Review of Systems: Per HPI. - Past Medical History: Patient Active Problem List   Diagnosis Date Noted  . Bilateral chronic knee pain 03/05/2017  . Foot pain 06/23/2012  . HLD (hyperlipidemia) 02/25/2012  . DM (diabetes mellitus) (HCC) 02/25/2012  . Hypothyroidism 02/25/2012   - Medications: reviewed and updated   Objective:   Physical Exam BP 120/82   Pulse 67   Temp 97.9 F (36.6 C) (Oral)   Ht 4\' 8"  (1.422 m)   Wt 147 lb 6.4 oz (66.9 kg)   SpO2 96%   BMI 33.05 kg/m  Gen: NAD, alert, cooperative with exam, well-appearing HEENT: NCAT, PERRL, clear conjunctiva, oropharynx clear, supple neck CV: RRR, good S1/S2, no murmur, no edema, capillary refill brisk  Resp: CTABL, no wheezes, non-labored Abd: SNTND, BS present, no guarding or organomegaly Skin: no rashes, normal turgor  Neuro: no gross deficits.  Psych: good insight, alert and oriented     Assessment & Plan:   1. Healthcare maintenance Patient has never had a colonoscopy. Discussed importance of colon cancer screening and handout given regarding colonoscopies. Encouraged patient to schedule GI appointment.   2. Estrogen deficiency - DG Bone Density; Future  3. Exposure to viral disease Based on age will obtain Hep C Screening.  - Hepatitis C antibody  4. Screening for breast cancer Previously normal mammograms.  - MM DIGITAL SCREENING BILATERAL; Future  5. Need for vaccination with 13-polyvalent pneumococcal conjugate vaccine - Pneumococcal conjugate vaccine 13-valent IM  6. Need for immunization against influenza - Flu Vaccine QUAD 36+ mos IM   Marcy Sirenatherine Aymara Sassi, D.O. 03/18/2017, 10:02 AM PGY-3, [facility]

## 2017-03-18 ENCOUNTER — Telehealth: Payer: Self-pay | Admitting: *Deleted

## 2017-03-18 NOTE — Telephone Encounter (Signed)
Pt came in office yesterday and this was discussed. Brittany Stout, Brittany Stout, [place]

## 2017-03-18 NOTE — Telephone Encounter (Signed)
-----   Message from Arvilla Marketatherine Lauren Wallace, DO sent at 03/15/2017  3:23 PM EDT ----- Please call patient to let her know that knee x-rays showed arthritis on both knees. She is welcome to return for steroid injections to her knees if she has a sudden increase in her pain.   Marcy Sirenatherine Wallace, D.O. 03/15/2017, 3:23 PM PGY-3, [facility]

## 2017-04-13 ENCOUNTER — Ambulatory Visit
Admission: RE | Admit: 2017-04-13 | Discharge: 2017-04-13 | Disposition: A | Discharge status: Home / Self Care | Payer: Medicare Other | Source: Ambulatory Visit | Attending: Family Medicine | Admitting: Family Medicine

## 2017-04-13 DIAGNOSIS — E2839 Other primary ovarian failure: Secondary | ICD-10-CM

## 2017-04-13 DIAGNOSIS — Z1239 Encounter for other screening for malignant neoplasm of breast: Secondary | ICD-10-CM

## 2017-05-04 ENCOUNTER — Telehealth: Payer: Self-pay | Admitting: *Deleted

## 2017-05-04 NOTE — Telephone Encounter (Signed)
Contacted pt via [employer] [facility] [facility] Hashimotoat[facility] #[id number] to see about scheduling this appointment and pt daughter said pt was leaving to go out of town tomorrow for about 1 month and I told them I would check with doctor to see if pt could schedule when she gets back.  Contacted PCP and she said it would be fine for pt to come in when she gets back. Informed pt daughter of this and they will schedule when she gets back.[name], Sheanna Dail D, [place]

## 2017-05-04 NOTE — Telephone Encounter (Signed)
-----   Message from Arvilla Marketatherine Lauren Wallace, DO sent at 04/26/2017  2:29 PM EST ----- Please call patient to have her schedule an appointment so we can discuss her DEXA results.   Marcy Sirenatherine Wallace, D.O. 04/26/2017, 2:29 PM PGY-3, [facility]

## 2017-05-20 ENCOUNTER — Other Ambulatory Visit: Payer: Self-pay | Admitting: Internal Medicine

## 2017-06-17 ENCOUNTER — Other Ambulatory Visit: Payer: Self-pay | Admitting: Internal Medicine

## 2017-07-15 ENCOUNTER — Other Ambulatory Visit: Payer: Self-pay | Admitting: Internal Medicine

## 2017-08-16 ENCOUNTER — Other Ambulatory Visit: Payer: Self-pay | Admitting: Internal Medicine

## 2017-10-07 ENCOUNTER — Ambulatory Visit: Payer: Medicare Other | Admitting: Internal Medicine

## 2017-10-07 ENCOUNTER — Ambulatory Visit (INDEPENDENT_AMBULATORY_CARE_PROVIDER_SITE_OTHER): Payer: Medicare Other | Admitting: Internal Medicine

## 2017-10-07 ENCOUNTER — Encounter: Payer: Self-pay | Admitting: Internal Medicine

## 2017-10-07 ENCOUNTER — Other Ambulatory Visit: Payer: Self-pay

## 2017-10-07 VITALS — BP 100/60 | Temp 98.2°F | Ht <= 58 in | Wt 144.0 lb

## 2017-10-07 DIAGNOSIS — R197 Diarrhea, unspecified: Secondary | ICD-10-CM

## 2017-10-07 DIAGNOSIS — E1142 Type 2 diabetes mellitus with diabetic polyneuropathy: Secondary | ICD-10-CM | POA: Diagnosis present

## 2017-10-07 LAB — POCT GLYCOSYLATED HEMOGLOBIN (HGB A1C): Hemoglobin A1C: 6.1

## 2017-10-07 NOTE — Progress Notes (Signed)
   Subjective:    Brittany Stout - 72 y.o. female MRN [id number]  Date of birth: 06-05-1945  HPI  Brittany Stout is here for follow up.  T2DM: Patient takes Metformin 1000 mg BID. Reports compliance. She does not check her CBGs at home. Last A1c 6.1 in Oct 2018. No polyuria, vision changes, polydipsia.   Diarrhea: Patient reports diarrhea x1 month, occurring daily. Started while she was visiting family in [place]. Originally attributed diarrhea to the food there but it has persisted since she came home over 2 weeks ago. Reports 2-4 episodes of loose stool per day. No abdominal pain, vomiting, or constipation. Denies hematochezia or melena. No contacts with similar symptoms.    -  reports that she has never smoked. She has never used smokeless tobacco. - Review of Systems: Per HPI. - Past Medical History: Patient Active Problem List   Diagnosis Date Noted  . Bilateral chronic knee pain 03/05/2017  . Foot pain 06/23/2012  . HLD (hyperlipidemia) 02/25/2012  . DM (diabetes mellitus) (HCC) 02/25/2012  . Hypothyroidism 02/25/2012   - Medications: reviewed and updated   Objective:   Physical Exam BP 100/60   Temp 98.2 F (36.8 C) (Oral)   Ht  (1.422 m)   Wt 144 lb (65.3 kg)   BMI 32.28 kg/m  Gen: NAD, alert, cooperative with exam, well-appearing Abd: SNTND, BS present, no guarding or organomegaly    Assessment & Plan:   1. Type 2 diabetes mellitus with diabetic polyneuropathy, without long-term current use of insulin (HCC) A1c with result of 6.1%, unchanged from prior visit and within goal for age. Continue Metformin. Recheck A1c in 6 months.  - POCT glycosylated hemoglobin (Hb A1C)  2. Diarrhea, unspecified type Patient with >1 month of diarrhea with recent trip to [place]. Will check stool studies looking for possible etiologies. Abdominal exam is benign and patient does not appear dehydrated on exam. Have discussed with patient in the past that I recommend colonoscopy.  Again re-iterated this and given handout especially given new change in stool pattern.  - GI Profile, Stool, PCR; Future - Ova and parasite examination; Future - Stool culture; Future   Marcy Siren, D.O. 10/07/2017, 3:15 PM PGY-3, [facility]

## 2017-10-07 NOTE — Patient Instructions (Signed)
Please return with the stool samples and we will test for parasites, bacterial infections that can cause prolonged diarrhea. I would also recommend getting the colonoscopy, the screening test for colon cancer that we discussed.

## 2018-04-11 IMAGING — MG DIGITAL SCREENING BILATERAL MAMMOGRAM WITH CAD
4 series · 4 of 4 positions shown · non-contrast
Comparison: Previous exam(s).

ACR Breast Density Category a: The breast tissue is almost entirely
fatty.

CLINICAL DATA: Screening.

EXAM:
DIGITAL SCREENING BILATERAL MAMMOGRAM WITH CAD

[L CC]
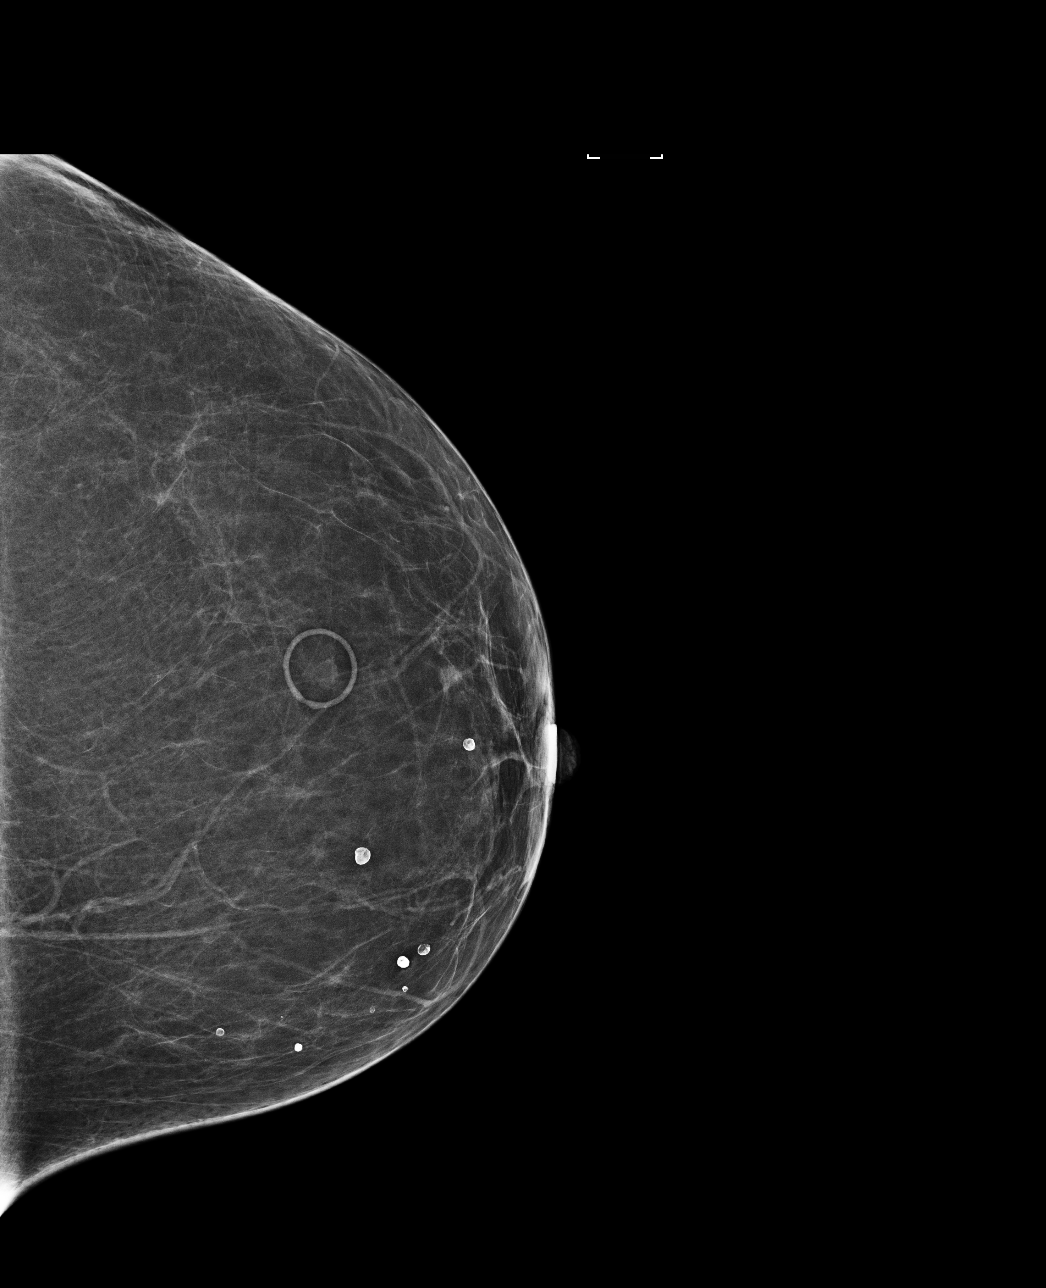

[L MLO]
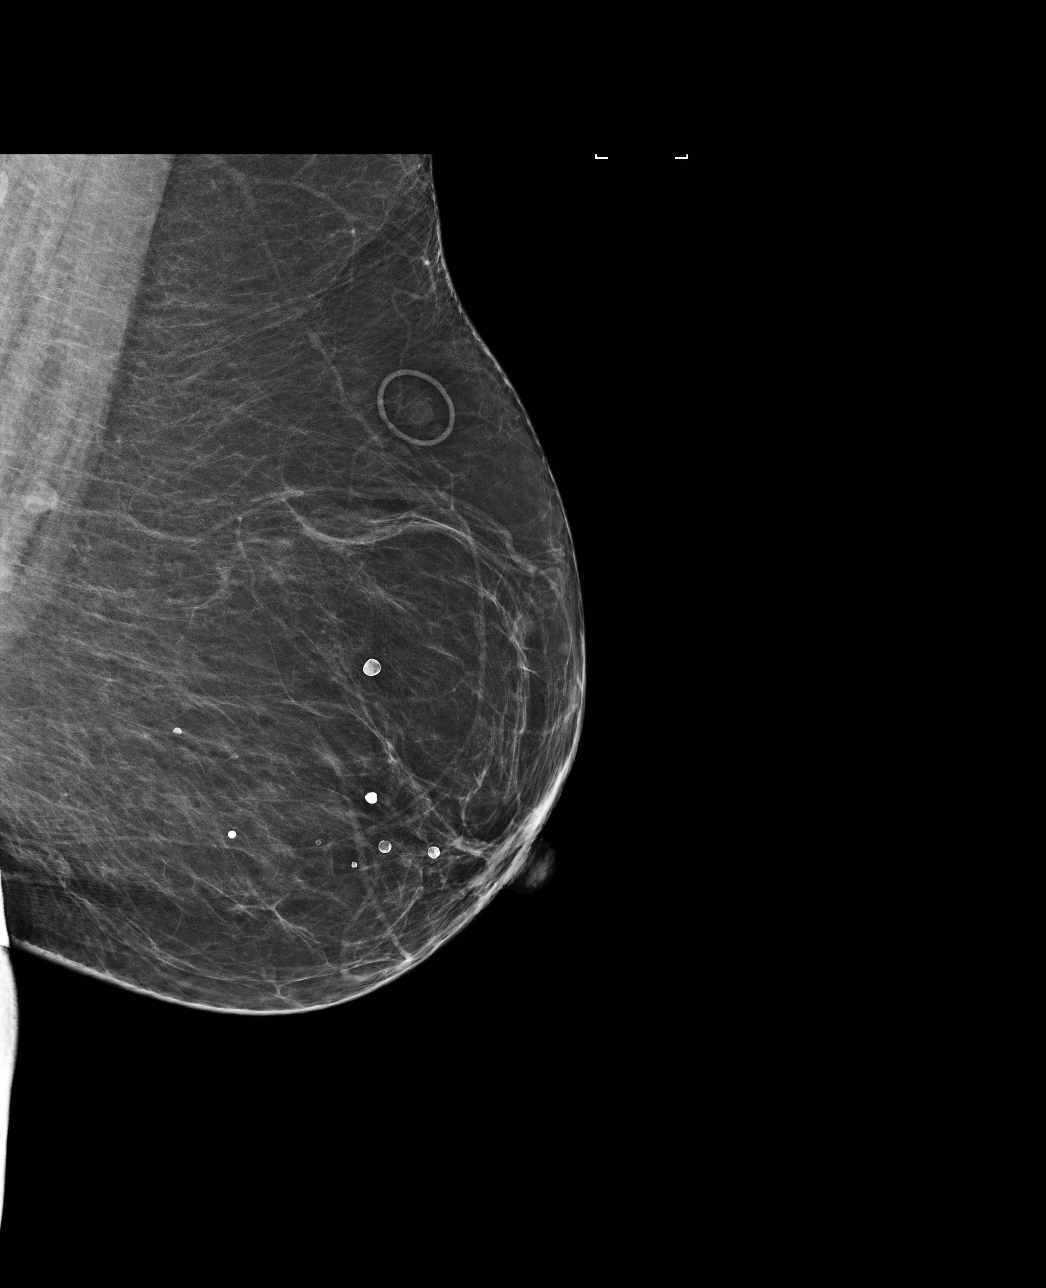

[R MLO]
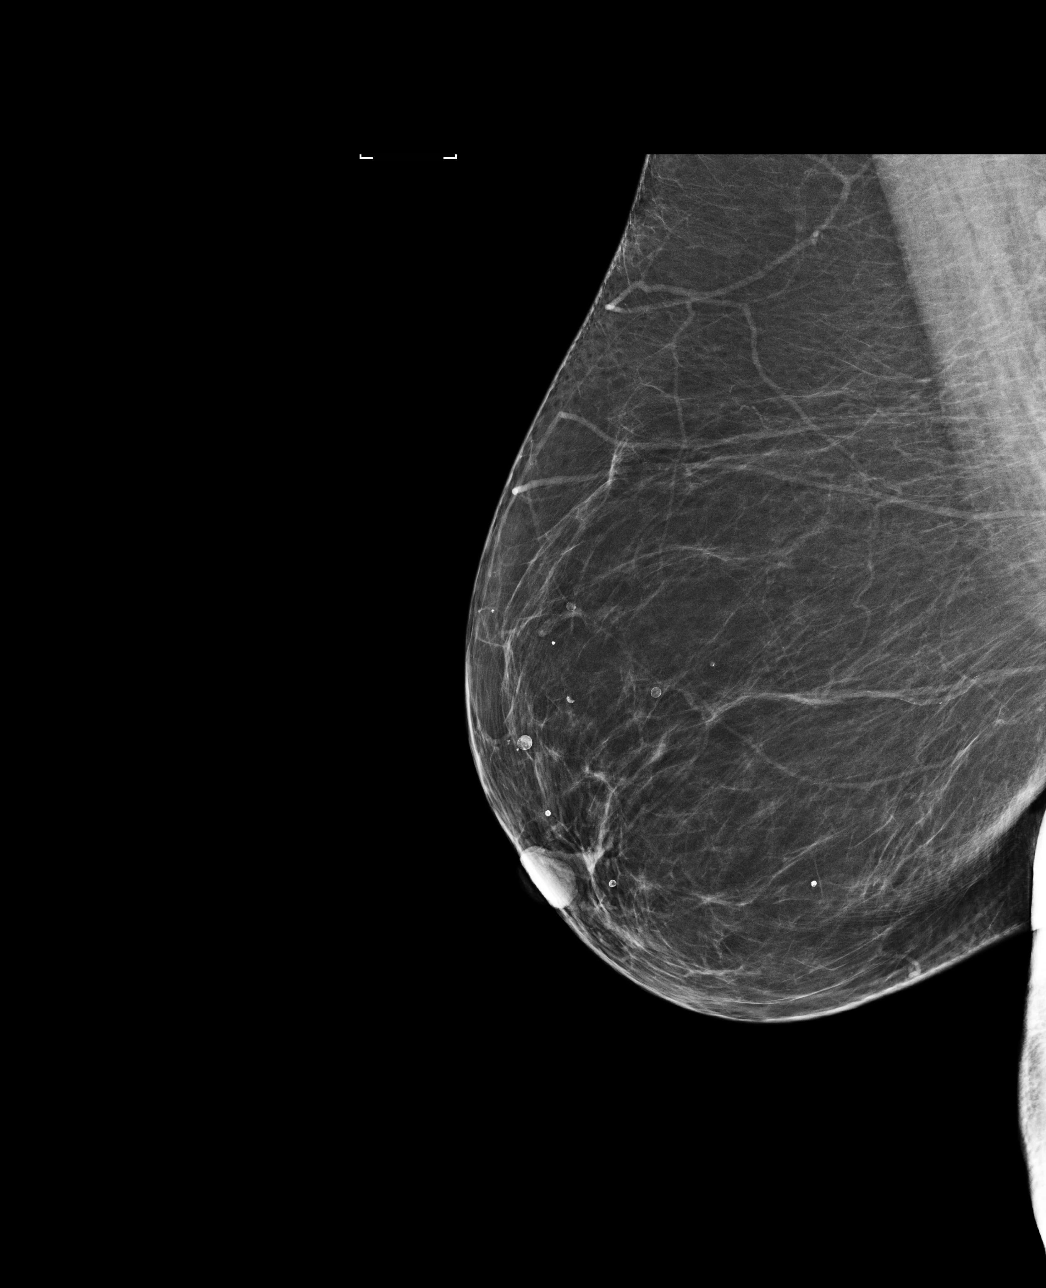

[R CC]
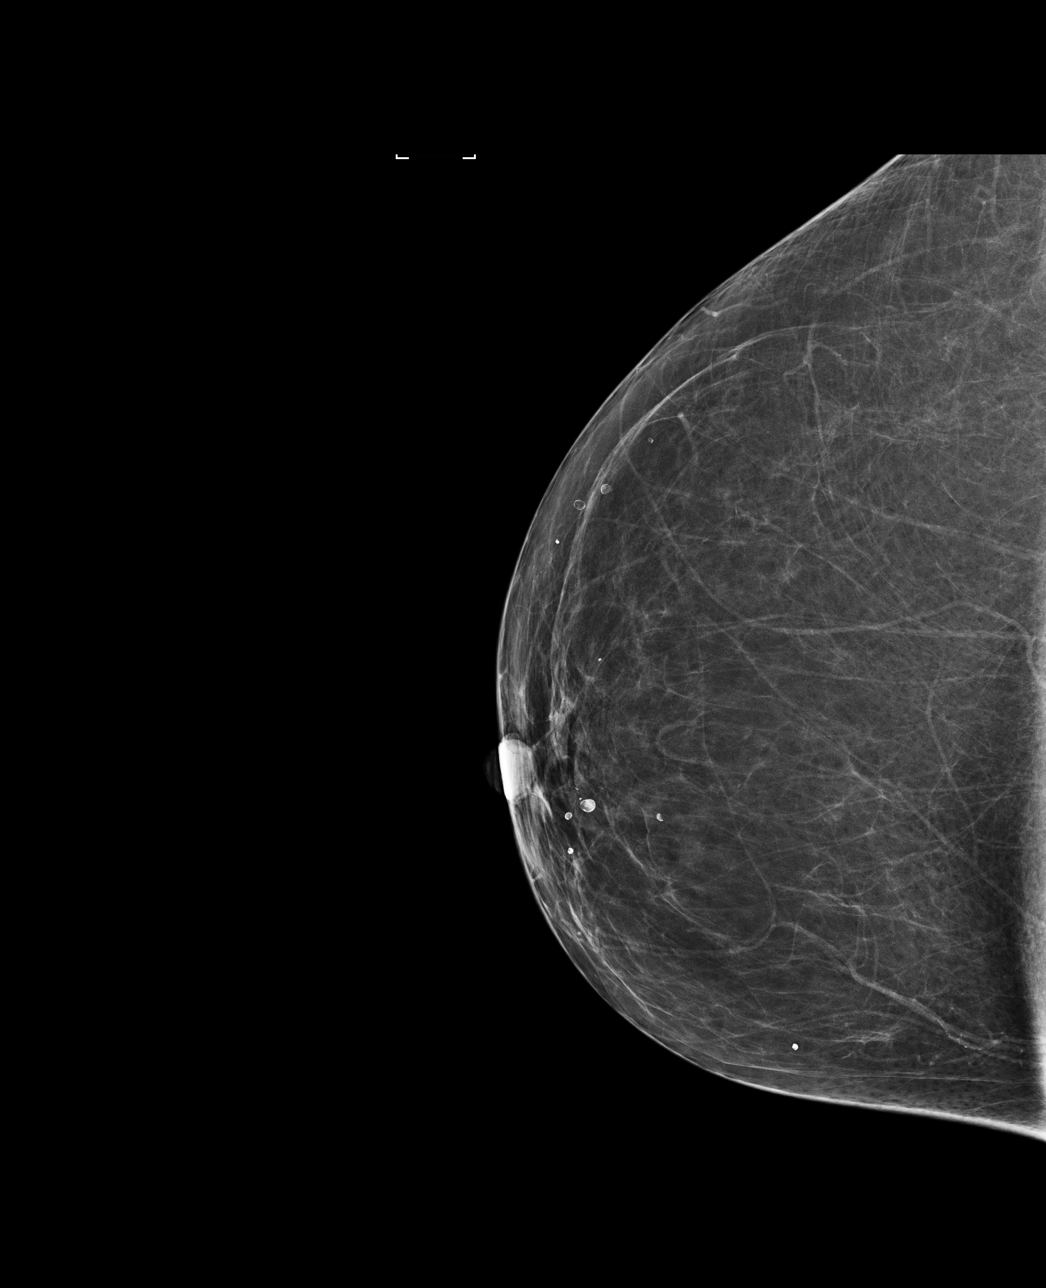

[4 of 4 positions shown; findings below may reference images not displayed]

FINDINGS: There are no findings suspicious for malignancy. Images were
processed with CAD.
IMPRESSION: No mammographic evidence of malignancy. A result letter of this
screening mammogram will be mailed directly to the patient.

RECOMMENDATION:
Screening mammogram in one year. (Code:[id number])

BI-RADS CATEGORY  1: Negative.

## 2018-04-18 ENCOUNTER — Other Ambulatory Visit: Payer: Self-pay

## 2018-04-18 MED ORDER — METFORMIN HCL 1000 MG PO TABS: 1000.0000 mg | ORAL_TABLET | Freq: Two times a day (BID) | ORAL | 3 refills | Status: DC

## 2018-04-18 NOTE — Telephone Encounter (Signed)
Please call patient and schedule a follow up appointment for diabetes. Looks like she is seen about q6 months. I have refilled her metformin as requested. Thank you!

## 2018-04-19 NOTE — Telephone Encounter (Signed)
Used [facility]/[id number] to lvm from previous message. [name], [name], [place]

## 2018-04-19 NOTE — Telephone Encounter (Signed)
LVM to call office back to inform pt that her Rx has been filled and that she needs to schedule an appointment for a diabetes check. Please assist her in getting this scheduled if she calls back. [name], April D, [place]

## 2018-08-03 NOTE — Progress Notes (Signed)
Patient referred by [name], MD for systolic murmur, hyperlipidemia  Subjective:   Brittany Stout, female    DOB: 04/10/1946, 73 y.o.   MRN: [id number]   Chief Complaint  Patient presents with  . Hyperlipidemia  . Heart Murmur     HPI  73 y.o. Hipanic female with referred for evaluation of systolic murmur, hyperlipidemia.  Patient is here today with her daughter Izora Gala, and interprteter Timberlake  Patient lives with her daughter. She stays at home, enjoys cooking. She does occasional walks at the park. She reports left sided, short lasting chest pain-only when she walks briefly or worries about her children. Episodes occur about once a week. She reports occasional dyspnea on exertion.  She has occasional episodes of "forgetfullness"- such as inability remember where she kept her pills. For the most part, she is able to maintain her day to day quality of life without any difficulties.  She has 6 children, 4 in [place], two in the [place].   She recently saw Jaynee Eagles, PA for a a benign foot injury she received while in [place]. This has since improved. Her PCP is [facility].  She had a lipid panel in 2017, that showed TG 446.   Past Medical History:  Diagnosis Date  . Diabetes mellitus ([place])   . HLD (hyperlipidemia)   . Hypothyroid      Past Surgical History:  Procedure Laterality Date  . CYST EXCISION     Face     Social History   Socioeconomic History  . Marital status: Married    Spouse name: Not on file  . Number of children: 6  . Years of education: Not on file  . Highest education level: Not on file  Occupational History  . Not on file  Social Needs  . Financial resource strain: Not on file  . Food insecurity:    Worry: Not on file    Inability: Not on file  . Transportation needs:    Medical: Not on file    Non-medical: Not on file  Tobacco Use  . Smoking status: Never Smoker  . Smokeless tobacco: Never Used  Substance and  Sexual Activity  . Alcohol use: No  . Drug use: Never  . Sexual activity: Not on file  Lifestyle  . Physical activity:    Days per week: Not on file    Minutes per session: Not on file  . Stress: Not on file  Relationships  . Social connections:    Talks on phone: Not on file    Gets together: Not on file    Attends religious service: Not on file    Active member of club or organization: Not on file    Attends meetings of clubs or organizations: Not on file    Relationship status: Not on file  . Intimate partner violence:    Fear of current or ex partner: Not on file    Emotionally abused: Not on file    Physically abused: Not on file    Forced sexual activity: Not on file  Other Topics Concern  . Not on file  Social History Narrative   Lives with daughter, son-in-law and grandsons. Retired from working at a school as a Medical illustrator.      Current Outpatient Medications on File Prior to Visit  Medication Sig Dispense Refill  . ibuprofen (ADVIL,MOTRIN) 100 MG tablet Take 100 mg by mouth every 6 (six) hours as needed for fever.    Marland Kitchen  meloxicam (MOBIC) 15 MG tablet TAKE 1 TABLET BY MOUTH DAILY AS NEEDED FOR PAIN 30 tablet 0  . metFORMIN (GLUCOPHAGE) 1000 MG tablet Take 1 tablet (1,000 mg total) by mouth 2 (two) times daily with a meal. 180 tablet 3   No current facility-administered medications on file prior to visit.     Cardiovascular studies:  EKG 08/04/2018: Sinus rhythm 67 bpm Left anterior fascicular block Nonspecific ST-T changes  Recent labs: 06/06/2018: Glucose 100. BUN/Cr 12/0.66. eGFR 89. Na/K 140/4.7.  H/H 13/41. MCV 87. Platelets 329  Lipid Panel     Component Value Date/Time  CHOL 178 04/29/2016 1621  TRIG 446 (H) 04/29/2016 1621  HDL 17 (L) 04/29/2016 1621  CHOLHDL 10.5 (H) 04/29/2016 1621  VLDL NOT CALC 04/29/2016 1621  LDLCALC NOT CALC 04/29/2016 1621     Review of Systems  Constitution: Negative for decreased appetite, malaise/fatigue,  weight gain and weight loss.  HENT: Negative for congestion.   Eyes: Negative for visual disturbance.  Cardiovascular: Negative for chest pain, dyspnea on exertion, leg swelling, palpitations and syncope.  Respiratory: Negative for shortness of breath.   Endocrine: Negative for cold intolerance.  Hematologic/Lymphatic: Does not bruise/bleed easily.  Skin: Negative for itching and rash.  Musculoskeletal: Negative for myalgias.  Gastrointestinal: Negative for abdominal pain, nausea and vomiting.  Genitourinary: Negative for dysuria.  Neurological: Negative for dizziness and weakness.  Psychiatric/Behavioral: The patient is not nervous/anxious.   All other systems reviewed and are negative.        Vitals:   08/04/18 1515  BP: 118/72  Pulse: 60  SpO2: 96%    Objective:   Physical Exam  Constitutional: She is oriented to person, place, and time. She appears well-developed and well-nourished. No distress.  HENT:  Head: Normocephalic and atraumatic.  Eyes: Pupils are equal, round, and reactive to light. Conjunctivae are normal.  Neck: No JVD present.  Cardiovascular: Normal rate, regular rhythm and intact distal pulses.  Murmur (II/VI ESM RUSB) heard. Pulmonary/Chest: Effort normal and breath sounds normal. She has no wheezes. She has no rales.  Abdominal: Soft. Bowel sounds are normal. There is no rebound.  Musculoskeletal:        General: No edema.  Lymphadenopathy:    She has no cervical adenopathy.  Neurological: She is alert and oriented to person, place, and time. No cranial nerve deficit.  Skin: Skin is warm and dry.  Psychiatric: She has a normal mood and affect.  Nursing note and vitals reviewed.         Assessment & Recommendations:    73 y/o Hispanic female with controlled type 2 DM, hyperlipidemia, murmur, exertional chest pain.  1. Systolic murmur Likely benign flow murmur. Will obtain echocardiogram.  2. Mixed hyperlipidemia TG was 446 in 2017.,  Suspect this may have been due to diabetes. Will obtain fasting lipid panel.   3. Exertional chest pain Occasional. She prefers conservative medical management at this time. Recommend aspirin 81 mg daily-in absence of any bleeding issues- and as needed SL NTG.  4. Type 2 diabetes mellitus without complication, without long-term current use of insulin (HCC) Continue metformin. Follow up with PCP    Thank you for referring the patient to [place]. Please feel free to contact with any questions.  Nigel Mormon, MD [facility] Pager: [phone] Office: [phone] If no answer Cell [phone]

## 2018-08-04 ENCOUNTER — Encounter: Payer: Self-pay | Admitting: Cardiology

## 2018-08-04 ENCOUNTER — Ambulatory Visit (INDEPENDENT_AMBULATORY_CARE_PROVIDER_SITE_OTHER): Payer: Medicare Other | Admitting: Cardiology

## 2018-08-04 ENCOUNTER — Other Ambulatory Visit: Payer: Self-pay

## 2018-08-04 VITALS — BP 118/72 | HR 60 | Ht 60.0 in | Wt 141.0 lb

## 2018-08-04 DIAGNOSIS — R011 Cardiac murmur, unspecified: Secondary | ICD-10-CM | POA: Diagnosis not present

## 2018-08-04 DIAGNOSIS — E119 Type 2 diabetes mellitus without complications: Secondary | ICD-10-CM

## 2018-08-04 DIAGNOSIS — R079 Chest pain, unspecified: Secondary | ICD-10-CM

## 2018-08-04 DIAGNOSIS — E782 Mixed hyperlipidemia: Secondary | ICD-10-CM | POA: Diagnosis not present

## 2018-08-04 MED ORDER — NITROGLYCERIN 0.4 MG SL SUBL: 0.4000 mg | SUBLINGUAL_TABLET | SUBLINGUAL | 3 refills | Status: AC | PRN

## 2018-08-04 MED ORDER — ASPIRIN EC 81 MG PO TBEC: 81.0000 mg | DELAYED_RELEASE_TABLET | Freq: Every day | ORAL | 3 refills | Status: DC

## 2018-08-24 ENCOUNTER — Other Ambulatory Visit: Payer: Medicare Other

## 2018-09-16 ENCOUNTER — Ambulatory Visit: Payer: Medicare Other | Admitting: Cardiology

## 2018-10-12 LAB — LIPID PANEL
CHOL/HDL RATIO: 6.2 ratio — AB (ref 0.0–4.4)
Cholesterol, Total: 154 mg/dL (ref 100–199)
HDL: 25 mg/dL — AB (ref >39)
LDL CALC: 86 mg/dL (ref 0–99)
Triglycerides: 216 mg/dL — ABNORMAL HIGH (ref 0–149)
VLDL Cholesterol Cal: 43 mg/dL — ABNORMAL HIGH (ref 5–40)

## 2018-10-19 ENCOUNTER — Other Ambulatory Visit: Payer: Medicare Other

## 2018-10-19 ENCOUNTER — Other Ambulatory Visit: Payer: Self-pay

## 2018-10-19 ENCOUNTER — Ambulatory Visit (INDEPENDENT_AMBULATORY_CARE_PROVIDER_SITE_OTHER): Payer: Medicare Other

## 2018-10-19 DIAGNOSIS — R011 Cardiac murmur, unspecified: Secondary | ICD-10-CM

## 2018-10-26 ENCOUNTER — Ambulatory Visit: Payer: Medicare Other | Admitting: Cardiology

## 2018-10-26 ENCOUNTER — Other Ambulatory Visit: Payer: Self-pay

## 2018-10-26 NOTE — Progress Notes (Deleted)
Patient referred by Brittany Oliphant, MD for systolic murmur, hyperlipidemia  Subjective:   Brittany Stout, female    DOB: [birth year], 73 y.o.   MRN: [id number]  I connected with the patient on ***'@TODAY'$ @ by a video enabled telemedicine application and verified that I am speaking with the correct person using two identifiers.     I discussed the limitations of evaluation and management by telemedicine and the availability of in person appointments. The patient expressed understanding and agreed to proceed.   This visit type was conducted due to national recommendations for restrictions regarding the COVID-19 Pandemic (e.g. social distancing).  This format is felt to be most appropriate for this patient at this time.  All issues noted in this document were discussed and addressed.  No physical exam was performed (except for noted visual exam findings with Tele health visits).  The patient has consented to conduct a Tele health visit and understands insurance will be billed.   No chief complaint on file.    HPI  73 y.o. Hipanic female with referred for evaluation of systolic murmur, hyperlipidemia.  Workup showed moderate AI and moderate MR. Stress test was not performed, as the patient preferred medical management for possible CAD.  ***  Past Medical History:  Diagnosis Date  . Diabetes mellitus ([place])   . HLD (hyperlipidemia)   . Hypothyroid      Past Surgical History:  Procedure Laterality Date  . CYST EXCISION     Face     Social History   Socioeconomic History  . Marital status: Legally Separated    Spouse name: Not on file  . Number of children: 6  . Years of education: Not on file  . Highest education level: Not on file  Occupational History  . Not on file  Social Needs  . Financial resource strain: Not on file  . Food insecurity:    Worry: Not on file    Inability: Not on file  . Transportation needs:    Medical: Not on file    Non-medical: Not on file   Tobacco Use  . Smoking status: Never Smoker  . Smokeless tobacco: Never Used  Substance and Sexual Activity  . Alcohol use: No  . Drug use: Never  . Sexual activity: Not on file  Lifestyle  . Physical activity:    Days per week: Not on file    Minutes per session: Not on file  . Stress: Not on file  Relationships  . Social connections:    Talks on phone: Not on file    Gets together: Not on file    Attends religious service: Not on file    Active member of club or organization: Not on file    Attends meetings of clubs or organizations: Not on file    Relationship status: Not on file  . Intimate partner violence:    Fear of current or ex partner: Not on file    Emotionally abused: Not on file    Physically abused: Not on file    Forced sexual activity: Not on file  Other Topics Concern  . Not on file  Social History Narrative   Lives with daughter, son-in-law and grandsons. Retired from working at a school as a Medical illustrator.      Current Outpatient Medications on File Prior to Visit  Medication Sig Dispense Refill  . aspirin EC 81 MG tablet Take 1 tablet (81 mg total) by mouth daily. 90 tablet 3  .  ibuprofen (ADVIL,MOTRIN) 100 MG tablet Take 100 mg by mouth every 6 (six) hours as needed for fever.    . meloxicam (MOBIC) 15 MG tablet TAKE 1 TABLET BY MOUTH DAILY AS NEEDED FOR PAIN 30 tablet 0  . metFORMIN (GLUCOPHAGE) 1000 MG tablet Take 1 tablet (1,000 mg total) by mouth 2 (two) times daily with a meal. 180 tablet 3  . nitroGLYCERIN (NITROSTAT) 0.4 MG SL tablet Place 1 tablet (0.4 mg total) under the tongue every 5 (five) minutes as needed for chest pain. 30 tablet 3   No current facility-administered medications on file prior to visit.     Cardiovascular studies:  Echocardiogram 10/19/2018: Normal LV systolic function with EF 60%. Left ventricle cavity is normal in size. Mild concentric hypertrophy of the left ventricle. Normal global wall motion. Doppler evidence  of grade I (impaired) diastolic dysfunction, indeterminate LAP. Calculated EF 60%. Trileaflet aortic valve with mild aortic valve leaflet calcification. Mild aortic valve stenosis. Moderate (Grade II) aortic regurgitation. Aortic valve mean gradient of 12 mmHg, Vmax of 2.4 m/s. Calculated aortic valve area by continuity equation is 1.02 cm. Aortic valve area likely underestimated on Doppler. Moderate (Grade III) mitral regurgitation. Mild tricuspid regurgitation. Estimated pulmonary artery systolic pressure 24 mmHg.   EKG 08/04/2018: Sinus rhythm 67 bpm Left anterior fascicular block Nonspecific ST-T changes  Recent labs: 06/06/2018: Glucose 100. BUN/Cr 12/0.66. eGFR 89. Na/K 140/4.7.  H/H 13/41. MCV 87. Platelets 329  Lipid Panel     Component Value Date/Time  CHOL 178 04/29/2016 1621  TRIG 446 (H) 04/29/2016 1621  HDL 17 (L) 04/29/2016 1621  CHOLHDL 10.5 (H) 04/29/2016 1621  VLDL NOT CALC 04/29/2016 1621  LDLCALC NOT CALC 04/29/2016 1621     Review of Systems  Constitution: Negative for decreased appetite, malaise/fatigue, weight gain and weight loss.  HENT: Negative for congestion.   Eyes: Negative for visual disturbance.  Cardiovascular: Negative for chest pain, dyspnea on exertion, leg swelling, palpitations and syncope.  Respiratory: Negative for shortness of breath.   Endocrine: Negative for cold intolerance.  Hematologic/Lymphatic: Does not bruise/bleed easily.  Skin: Negative for itching and rash.  Musculoskeletal: Negative for myalgias.  Gastrointestinal: Negative for abdominal pain, nausea and vomiting.  Genitourinary: Negative for dysuria.  Neurological: Negative for dizziness and weakness.  Psychiatric/Behavioral: The patient is not nervous/anxious.   All other systems reviewed and are negative.        There were no vitals filed for this visit.  Objective:   Physical Exam  Constitutional: She is oriented to person, place, and time. She appears  well-developed and well-nourished. No distress.  HENT:  Head: Normocephalic and atraumatic.  Eyes: Pupils are equal, round, and reactive to light. Conjunctivae are normal.  Neck: No JVD present.  Cardiovascular: Normal rate, regular rhythm and intact distal pulses.  Murmur (II/VI ESM RUSB) heard. Pulmonary/Chest: Effort normal and breath sounds normal. She has no wheezes. She has no rales.  Abdominal: Soft. Bowel sounds are normal. There is no rebound.  Musculoskeletal:        General: No edema.  Lymphadenopathy:    She has no cervical adenopathy.  Neurological: She is alert and oriented to person, place, and time. No cranial nerve deficit.  Skin: Skin is warm and dry.  Psychiatric: She has a normal mood and affect.  Nursing note and vitals reviewed.         Assessment & Recommendations:    73 y/o Hispanic female with controlled type 2 DM, hyperlipidemia, murmur, exertional chest  pain.  1. Systolic murmur Likely benign flow murmur. Will obtain echocardiogram.  2. Mixed hyperlipidemia TG was 446 in 2017., Suspect this may have been due to diabetes. Will obtain fasting lipid panel.   3. Exertional chest pain Occasional. She prefers conservative medical management at this time. Recommend aspirin 81 mg daily-in absence of any bleeding issues- and as needed SL NTG.  4. Type 2 diabetes mellitus without complication, without long-term current use of insulin (HCC) Continue metformin. Follow up with PCP    Thank you for referring the patient to [place]. Please feel free to contact with any questions.  Nigel Mormon, MD [facility] Pager: [phone] Office: [phone] If no answer Cell [phone]

## 2018-10-27 ENCOUNTER — Ambulatory Visit (INDEPENDENT_AMBULATORY_CARE_PROVIDER_SITE_OTHER): Payer: Medicare Other | Admitting: Cardiology

## 2018-10-27 ENCOUNTER — Other Ambulatory Visit: Payer: Self-pay

## 2018-10-27 VITALS — Ht 60.0 in | Wt 140.0 lb

## 2018-10-27 DIAGNOSIS — R600 Localized edema: Secondary | ICD-10-CM

## 2018-10-27 DIAGNOSIS — I340 Nonrheumatic mitral (valve) insufficiency: Secondary | ICD-10-CM | POA: Diagnosis not present

## 2018-10-27 DIAGNOSIS — I351 Nonrheumatic aortic (valve) insufficiency: Secondary | ICD-10-CM

## 2018-10-27 DIAGNOSIS — E782 Mixed hyperlipidemia: Secondary | ICD-10-CM

## 2018-10-27 MED ORDER — ROSUVASTATIN CALCIUM 10 MG PO TABS: 10.0000 mg | ORAL_TABLET | Freq: Every day | ORAL | 3 refills | Status: DC

## 2018-10-27 MED ORDER — FUROSEMIDE 20 MG PO TABS: 20.0000 mg | ORAL_TABLET | Freq: Every day | ORAL | 3 refills | Status: DC

## 2018-10-27 NOTE — Progress Notes (Signed)
Patient referred by [name], MD for systolic murmur, hyperlipidemia  Subjective:   Brittany Stout, female    DOB: Oct 02, 1945, 73 y.o.   MRN: [id number]   Chief Complaint  Patient presents with  . Heart Murmur    4 WEEK F/U   . Hyperlipidemia     73 y.o. Hipanic female with referred for evaluation of systolic murmur, hyperlipidemia.  Andreas Blower assisted with interpretation.   Patient is doing well, except for L>R leg swelling. No other complaints of chest pain shortness of breath. Echocardiogram and lipid panel reviewed with the patient.   Past Medical History:  Diagnosis Date  . Diabetes mellitus ([place])   . HLD (hyperlipidemia)   . Hypothyroid      Past Surgical History:  Procedure Laterality Date  . CYST EXCISION     Face     Social History   Socioeconomic History  . Marital status: Legally Separated    Spouse name: Not on file  . Number of children: 6  . Years of education: Not on file  . Highest education level: Not on file  Occupational History  . Not on file  Social Needs  . Financial resource strain: Not on file  . Food insecurity:    Worry: Not on file    Inability: Not on file  . Transportation needs:    Medical: Not on file    Non-medical: Not on file  Tobacco Use  . Smoking status: Never Smoker  . Smokeless tobacco: Never Used  Substance and Sexual Activity  . Alcohol use: No  . Drug use: Never  . Sexual activity: Not on file  Lifestyle  . Physical activity:    Days per week: Not on file    Minutes per session: Not on file  . Stress: Not on file  Relationships  . Social connections:    Talks on phone: Not on file    Gets together: Not on file    Attends religious service: Not on file    Active member of club or organization: Not on file    Attends meetings of clubs or organizations: Not on file    Relationship status: Not on file  . Intimate partner violence:    Fear of current or ex partner: Not on file   Emotionally abused: Not on file    Physically abused: Not on file    Forced sexual activity: Not on file  Other Topics Concern  . Not on file  Social History Narrative   Lives with daughter, son-in-law and grandsons. Retired from working at a school as a Medical illustrator.      Current Outpatient Medications on File Prior to Visit  Medication Sig Dispense Refill  . aspirin EC 81 MG tablet Take 1 tablet (81 mg total) by mouth daily. 90 tablet 3  . ibuprofen (ADVIL,MOTRIN) 100 MG tablet Take 100 mg by mouth every 6 (six) hours as needed for fever.    . meloxicam (MOBIC) 15 MG tablet TAKE 1 TABLET BY MOUTH DAILY AS NEEDED FOR PAIN 30 tablet 0  . metFORMIN (GLUCOPHAGE) 1000 MG tablet Take 1 tablet (1,000 mg total) by mouth 2 (two) times daily with a meal. 180 tablet 3  . nitroGLYCERIN (NITROSTAT) 0.4 MG SL tablet Place 1 tablet (0.4 mg total) under the tongue every 5 (five) minutes as needed for chest pain. 30 tablet 3   No current facility-administered medications on file prior to visit.     Cardiovascular studies:  Echocardiogram 10/19/2018: Normal LV systolic function with EF 60%. Left ventricle cavity is normal in size. Mild concentric hypertrophy of the left ventricle. Normal global wall motion. Doppler evidence of grade I (impaired) diastolic dysfunction, indeterminate LAP. Calculated EF 60%. Trileaflet aortic valve with mild aortic valve leaflet calcification. Mild aortic valve stenosis. Moderate (Grade II) aortic regurgitation. Aortic valve mean gradient of 12 mmHg, Vmax of 2.4 m/s. Calculated aortic valve area by continuity equation is 1.02 cm. Aortic valve area likely underestimated on Doppler. Moderate (Grade III) mitral regurgitation. Mild tricuspid regurgitation. Estimated pulmonary artery systolic pressure 24 mmHg.  EKG 08/04/2018: Sinus rhythm 67 bpm Left anterior fascicular block Nonspecific ST-T changes  Recent labs: 06/06/2018: Glucose 100. BUN/Cr 12/0.66. eGFR 89.  Na/K 140/4.7.  H/H 13/41. MCV 87. Platelets 329  Lipid Panel     Component Value Date/Time  CHOL 178 04/29/2016 1621  TRIG 446 (H) 04/29/2016 1621  HDL 17 (L) 04/29/2016 1621  CHOLHDL 10.5 (H) 04/29/2016 1621  VLDL NOT CALC 04/29/2016 1621  LDLCALC NOT CALC 04/29/2016 1621     Review of Systems  Constitution: Negative for decreased appetite, malaise/fatigue, weight gain and weight loss.  HENT: Negative for congestion.   Eyes: Negative for visual disturbance.  Cardiovascular: Negative for chest pain, dyspnea on exertion, leg swelling, palpitations and syncope.  Respiratory: Negative for shortness of breath.   Endocrine: Negative for cold intolerance.  Hematologic/Lymphatic: Does not bruise/bleed easily.  Skin: Negative for itching and rash.  Musculoskeletal: Negative for myalgias.  Gastrointestinal: Negative for abdominal pain, nausea and vomiting.  Genitourinary: Negative for dysuria.  Neurological: Negative for dizziness and weakness.  Psychiatric/Behavioral: The patient is not nervous/anxious.   All other systems reviewed and are negative.        No vitals available.   Objective:   Physical Exam  Constitutional: She is oriented to person, place, and time. She appears well-developed and well-nourished. No distress.  Pulmonary/Chest: Effort normal.  Neurological: She is alert and oriented to person, place, and time.  Psychiatric: She has a normal mood and affect.  Nursing note and vitals reviewed.         Assessment & Recommendations:   73 y.o. Hipanic female with mod MR, AI, hyperlipidemia.  Mod MR, mod AI: Continue symptomatic management with lasix.  Counseled re: salt restriction.  Hyperlipidemia: TG have improved. Added crestor given her diabetes.    Nigel Mormon, MD [facility] Pager: [phone] Office: [phone] If no answer Cell [phone]

## 2018-10-30 ENCOUNTER — Encounter: Payer: Self-pay | Admitting: Cardiology

## 2018-10-30 DIAGNOSIS — I340 Nonrheumatic mitral (valve) insufficiency: Secondary | ICD-10-CM | POA: Insufficient documentation

## 2018-10-30 DIAGNOSIS — R600 Localized edema: Secondary | ICD-10-CM | POA: Insufficient documentation

## 2018-10-30 DIAGNOSIS — I351 Nonrheumatic aortic (valve) insufficiency: Secondary | ICD-10-CM | POA: Insufficient documentation

## 2019-01-26 ENCOUNTER — Other Ambulatory Visit: Payer: Self-pay

## 2019-01-26 ENCOUNTER — Ambulatory Visit (INDEPENDENT_AMBULATORY_CARE_PROVIDER_SITE_OTHER): Payer: Medicare Other | Admitting: Cardiology

## 2019-01-26 ENCOUNTER — Encounter: Payer: Self-pay | Admitting: Cardiology

## 2019-01-26 VITALS — BP 126/82 | HR 82 | Ht 60.0 in | Wt 139.0 lb

## 2019-01-26 DIAGNOSIS — I209 Angina pectoris, unspecified: Secondary | ICD-10-CM | POA: Insufficient documentation

## 2019-01-26 DIAGNOSIS — I340 Nonrheumatic mitral (valve) insufficiency: Secondary | ICD-10-CM

## 2019-01-26 MED ORDER — ISOSORBIDE MONONITRATE ER 30 MG PO TB24: 30.0000 mg | ORAL_TABLET | Freq: Every day | ORAL | 3 refills | Status: DC

## 2019-01-26 NOTE — Progress Notes (Signed)
Patient referred by Melene Plan, MD for systolic murmur, hyperlipidemia  Subjective:   Brittany Stout, female    DOB: February 07, 1946, 73 y.o.   MRN: [id number]   Chief Complaint  Patient presents with  . Chest Pain     73 y.o. Hipanic female with referred for evaluation of systolic murmur, hyperlipidemia.  [name] assisted with interpretation.   Patient denies any shortness of breath.  Blood pressure is well controlled.  However, patient complains of left-sided focal chest pain, worse with exertion, relieved with nitroglycerin.  Past Medical History:  Diagnosis Date  . Diabetes mellitus (HCC)   . HLD (hyperlipidemia)   . Hypothyroid      Past Surgical History:  Procedure Laterality Date  . CYST EXCISION     Face     Social History   Socioeconomic History  . Marital status: Legally Separated    Spouse name: Not on file  . Number of children: 6  . Years of education: Not on file  . Highest education level: Not on file  Occupational History  . Not on file  Social Needs  . Financial resource strain: Not on file  . Food insecurity    Worry: Not on file    Inability: Not on file  . Transportation needs    Medical: Not on file    Non-medical: Not on file  Tobacco Use  . Smoking status: Never Smoker  . Smokeless tobacco: Never Used  Substance and Sexual Activity  . Alcohol use: No  . Drug use: Never  . Sexual activity: Not on file  Lifestyle  . Physical activity    Days per week: Not on file    Minutes per session: Not on file  . Stress: Not on file  Relationships  . Social Musician on phone: Not on file    Gets together: Not on file    Attends religious service: Not on file    Active member of club or organization: Not on file    Attends meetings of clubs or organizations: Not on file    Relationship status: Not on file  . Intimate partner violence    Fear of current or ex partner: Not on file    Emotionally abused: Not on  file    Physically abused: Not on file    Forced sexual activity: Not on file  Other Topics Concern  . Not on file  Social History Narrative   Lives with daughter, son-in-law and grandsons. Retired from working at a school as a Art therapist.      Current Outpatient Medications on File Prior to Visit  Medication Sig Dispense Refill  . aspirin EC 81 MG tablet Take 1 tablet (81 mg total) by mouth daily. 90 tablet 3  . ibuprofen (ADVIL,MOTRIN) 100 MG tablet Take 100 mg by mouth every 6 (six) hours as needed for fever.    . meloxicam (MOBIC) 15 MG tablet TAKE 1 TABLET BY MOUTH DAILY AS NEEDED FOR PAIN 30 tablet 0  . metFORMIN (GLUCOPHAGE) 1000 MG tablet Take 1 tablet (1,000 mg total) by mouth 2 (two) times daily with a meal. 180 tablet 3  . rosuvastatin (CRESTOR) 10 MG tablet Take 1 tablet (10 mg total) by mouth daily. 90 tablet 3  . furosemide (LASIX) 20 MG tablet Take 1 tablet (20 mg total) by mouth daily. 90 tablet 3  . nitroGLYCERIN (NITROSTAT) 0.4 MG SL tablet Place 1 tablet (0.4 mg total) under the  tongue every 5 (five) minutes as needed for chest pain. 30 tablet 3   No current facility-administered medications on file prior to visit.     Cardiovascular studies:  EKG 01/26/2019: Sinus rhythm 74 bpm. Left anterior fascicular block. Nonspecific anterior T wave inversion.   Echocardiogram 10/19/2018: Normal LV systolic function with EF 60%. Left ventricle cavity is normal in size. Mild concentric hypertrophy of the left ventricle. Normal global wall motion. Doppler evidence of grade I (impaired) diastolic dysfunction, indeterminate LAP. Calculated EF 60%. Trileaflet aortic valve with mild aortic valve leaflet calcification. Mild aortic valve stenosis. Moderate (Grade II) aortic regurgitation. Aortic valve mean gradient of 12 mmHg, Vmax of 2.4 m/s. Calculated aortic valve area by continuity equation is 1.02 cm. Aortic valve area likely underestimated on Doppler. Moderate (Grade  III) mitral regurgitation. Mild tricuspid regurgitation. Estimated pulmonary artery systolic pressure 24 mmHg.  EKG 08/04/2018: Sinus rhythm 67 bpm Left anterior fascicular block Nonspecific ST-T changes  Recent labs: Results for [name], [name] (MRN [id number]) as of 01/26/2019 16:55  Ref. Range 10/11/2018 09:15  Total CHOL/HDL Ratio Latest Ref Range: 0.0 - 4.4 ratio 6.2 (H)  Cholesterol, Total Latest Ref Range: 100 - 199 mg/dL 154  HDL Cholesterol Latest Ref Range: >39 mg/dL 25 (L)  LDL (calc) Latest Ref Range: 0 - 99 mg/dL 86  Triglycerides Latest Ref Range: 0 - 149 mg/dL 216 (H)  VLDL Cholesterol Cal Latest Ref Range: 5 - 40 mg/dL 43 (H)     Review of Systems  Constitution: Negative for decreased appetite, malaise/fatigue, weight gain and weight loss.  HENT: Negative for congestion.   Eyes: Negative for visual disturbance.  Cardiovascular: Positive for chest pain. Negative for dyspnea on exertion, leg swelling, palpitations and syncope.  Respiratory: Negative for shortness of breath.   Endocrine: Negative for cold intolerance.  Hematologic/Lymphatic: Does not bruise/bleed easily.  Skin: Negative for itching and rash.  Musculoskeletal: Negative for myalgias.  Gastrointestinal: Negative for abdominal pain, nausea and vomiting.  Genitourinary: Negative for dysuria.  Neurological: Negative for dizziness and weakness.  Psychiatric/Behavioral: The patient is not nervous/anxious.   All other systems reviewed and are negative.        Vitals:   01/26/19 1554  BP: 126/82  Pulse: 82  SpO2: 96%     Objective:   Physical Exam  Constitutional: She is oriented to person, place, and time. She appears well-developed and well-nourished. No distress.  HENT:  Head: Normocephalic and atraumatic.  Eyes: Pupils are equal, round, and reactive to light. Conjunctivae are normal.  Neck: No JVD present.  Cardiovascular: Normal rate, regular rhythm and intact distal pulses.  Murmur heard.  High-pitched blowing decrescendo early diastolic murmur is present with a grade of 2/6 at the upper right sternal border radiating to the apex. Pulmonary/Chest: Effort normal and breath sounds normal. She has no wheezes. She has no rales.  Abdominal: Soft. Bowel sounds are normal. There is no rebound.  Musculoskeletal:        General: No edema.  Lymphadenopathy:    She has no cervical adenopathy.  Neurological: She is alert and oriented to person, place, and time. No cranial nerve deficit.  Skin: Skin is warm and dry.  Psychiatric: She has a normal mood and affect.  Nursing note and vitals reviewed.         Assessment & Recommendations:   73 y.o. Hipanic female with mod MR, AI, hyperlipidemia.  Angina pectoris: Started Imdur 30 mg daily.  Baseline EKG with unchanged T wave inversions  in anterior leads.  Given abnormal EKG at rest, will obtain Lexiscan nuclear stress test. Continue aspirin and statin.  Mod MR, mod AI: Continue symptomatic management with lasix.  Counseled re: salt restriction.  Hyperlipidemia: Continue Crestor 10 mg daily.   Elder NegusManish J Aeron Lheureux, MD [facility] Pager: [phone] Office: [phone] If no answer Cell [phone]

## 2019-02-01 ENCOUNTER — Other Ambulatory Visit: Payer: Self-pay

## 2019-02-01 ENCOUNTER — Ambulatory Visit (INDEPENDENT_AMBULATORY_CARE_PROVIDER_SITE_OTHER): Payer: Medicare Other | Admitting: Family Medicine

## 2019-02-01 VITALS — BP 120/68 | HR 84 | Ht 60.0 in | Wt 139.1 lb

## 2019-02-01 DIAGNOSIS — E119 Type 2 diabetes mellitus without complications: Secondary | ICD-10-CM | POA: Diagnosis not present

## 2019-02-01 DIAGNOSIS — H6090 Unspecified otitis externa, unspecified ear: Secondary | ICD-10-CM | POA: Diagnosis not present

## 2019-02-01 DIAGNOSIS — H60502 Unspecified acute noninfective otitis externa, left ear: Secondary | ICD-10-CM

## 2019-02-01 DIAGNOSIS — H6092 Unspecified otitis externa, left ear: Secondary | ICD-10-CM | POA: Insufficient documentation

## 2019-02-01 DIAGNOSIS — I209 Angina pectoris, unspecified: Secondary | ICD-10-CM

## 2019-02-01 LAB — POCT GLYCOSYLATED HEMOGLOBIN (HGB A1C): HbA1c, POC (controlled diabetic range): 6.2 % (ref 0.0–7.0)

## 2019-02-01 MED ORDER — NEOMYCIN-POLYMYXIN-HC 3.5-10000-1 OT SOLN: 4.0000 [drp] | Freq: Four times a day (QID) | OTIC | 0 refills | Status: DC

## 2019-02-01 NOTE — Patient Instructions (Addendum)
It was wonderful seeing you today.  We are going to do some eardrops, place 4 drops in the ear 4 times daily for the next 7 days.  Should start to feel better in the next few days.  If your symptoms are not improving, worsening, continued bloody drainage, development of fever, or hearing loss--you should return to the clinic for further evaluation.   Please make sure that you are not using Q-tips any longer to clean out your ears.  You can use Debrox from your local pharmacy to help soften your earwax and then water during this out.  Wait until all your symptoms are resolved to continue this.  Please make sure you are following up with your primary care provider on a regular basis.

## 2019-02-01 NOTE — Assessment & Plan Note (Addendum)
1 day history of ear pain and mild bloody drainage in the setting of Q-tip use, with associated canal erythema and mild swelling.  No red flags per history. Suspect irritative otitis externa, could also consider secondary infection introduced with Q-tip.  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  Reassured that tympanic membrane appeared intact with appropriate light reflex and no additional swelling/erythema over mastoid to suggest mastoiditis. - Cortisporin 4 drops, 4 times daily for 7 days in the left ear - Avoid use of Q-tips, may use Debrox to soften earwax with irrigation - Return precautions discussed, including development of fever, not improving/worsening, continue drainage, hearing changes

## 2019-02-01 NOTE — Progress Notes (Signed)
   Subjective:    Patient ID: Brittany Stout, female    DOB: 1946/01/02, 73 y.o.   MRN: [id number]   CC: Ear pain/bleeding   HPI: Brittany Stout is a 73 year old female with a history of angina, well controlled DM, and hyperlipidemia presenting to discuss the following:   Ear pain: Left ear. Started around 3 am this morning, 9/9. Pain and warming sensation of something draining onto her shoulder woke her up out of sleep. States it was bloody drainage, few drops, "not that much ".  States the "ear feels very stiff" and is throbbing.  Pain is constant, but changes in severity throughout the day. Currently mild pain. Denies changes in hearing, tinnitus, fever, . Has not been scratching inside the canal recently. Does use Q-tips to clean her ears, did this yesterday during the day. Has never had anything like this before.  Denies any concurrent rhinorrhea, visual changes, sore throat, SOB, coughing.  Diabetes: Last A1c back in 2018, 6.1 at that time. She continues to take her metformin 1000 twice daily.  Smoking status reviewed  Review of Systems Per HPI  Objective:  BP 120/68   Pulse 84   Ht 5' (1.524 m)   Wt 139 lb 2 oz (63.1 kg)   SpO2 96%   BMI 27.17 kg/m  Vitals and nursing note reviewed  General: NAD, pleasant HEENT: Mucous membranes moist.  Right tympanic membrane and external canal unremarkable, with appropriate light reflex.  However, left TM clear with appropriate light reflex with erythematous external canal, mild swelling.  Do note dried and moist bloody drainage within the canal.  No erythema or swelling over mastoid process on the left.  No anterior or cervical lymphadenopathy present. Respiratory: CTAB, normal effort Skin: warm and dry, no rashes noted Neuro: alert and oriented, no focal deficits Psych: normal affect  Assessment & Plan:   Otitis externa of left ear 1 day history of ear pain and mild bloody drainage in the setting of Q-tip use, with associated canal  erythema and mild swelling.  No red flags per history. Suspect irritative otitis externa, could also consider secondary infection introduced with Q-tip.  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  Reassured that tympanic membrane appeared intact with appropriate light reflex and no additional swelling/erythema over mastoid to suggest mastoiditis. - Cortisporin 4 drops, 4 times daily for 7 days in the left ear - Avoid use of Q-tips, may use Debrox to soften earwax with irrigation - Return precautions discussed, including development of fever, not improving/worsening, continue drainage, hearing changes  DM (diabetes mellitus) Controlled.  A1c 6.2 today, 6.1 in 09/2017. - Continue metformin 1000mg  BID - Recommend diabetic follow-up with primary care provider within the next 1-2 months    Follow-up if not improving or worsening, additionally recommended follow-up with primary care provider in the next 1-2 months for chronic conditions and health maintenance.   [place] Resident PGY-2

## 2019-02-01 NOTE — Assessment & Plan Note (Signed)
Controlled.  A1c 6.2 today, 6.1 in 09/2017. - Continue metformin 1000mg  BID - Recommend diabetic follow-up with primary care provider within the next 1-2 months

## 2019-02-27 ENCOUNTER — Ambulatory Visit (INDEPENDENT_AMBULATORY_CARE_PROVIDER_SITE_OTHER): Payer: Medicare Other

## 2019-02-27 ENCOUNTER — Other Ambulatory Visit: Payer: Self-pay

## 2019-02-27 DIAGNOSIS — R0789 Other chest pain: Secondary | ICD-10-CM

## 2019-02-27 DIAGNOSIS — I209 Angina pectoris, unspecified: Secondary | ICD-10-CM

## 2019-02-27 NOTE — Progress Notes (Signed)
Stress test does show abnormalities. Her f/u with me is on 10/26. Please ask her if she would like to be seen sooner. If so, please schedule for in office f/u.    Thanks MJP

## 2019-03-06 ENCOUNTER — Other Ambulatory Visit: Payer: Self-pay

## 2019-03-06 ENCOUNTER — Encounter: Payer: Self-pay | Admitting: Cardiology

## 2019-03-06 ENCOUNTER — Ambulatory Visit (INDEPENDENT_AMBULATORY_CARE_PROVIDER_SITE_OTHER): Payer: Medicare Other | Admitting: Cardiology

## 2019-03-06 VITALS — BP 142/77 | HR 75 | Temp 97.7°F | Ht 60.0 in | Wt 140.0 lb

## 2019-03-06 DIAGNOSIS — I340 Nonrheumatic mitral (valve) insufficiency: Secondary | ICD-10-CM

## 2019-03-06 DIAGNOSIS — I209 Angina pectoris, unspecified: Secondary | ICD-10-CM

## 2019-03-06 DIAGNOSIS — E782 Mixed hyperlipidemia: Secondary | ICD-10-CM

## 2019-03-06 MED ORDER — METOPROLOL SUCCINATE ER 25 MG PO TB24: 25.0000 mg | ORAL_TABLET | Freq: Every day | ORAL | 3 refills | Status: DC

## 2019-03-06 NOTE — Progress Notes (Signed)
Patient referred by Melene PlanKim, Rachel E, MD for systolic murmur, hyperlipidemia  Subjective:   Jonna MunroEsther Apo, female    DOB: September 22, 1945, 73 y.o.   MRN: [id number]   Chief Complaint  Patient presents with  . Chest Pain  . Follow-up  . Results    stress test     73 y.o. Hipanic female with referred for evaluation of systolic murmur, hyperlipidemia.  Daughter present in the room. Cathlean CowerLesley Mares, CMA assisted with translation.  Patient walks up to 30 min every day. She has occasional episodes of chest pain, requiring NTG use-roughly 1-2 times/week. In addition, she has exertional dyspnea and fatigue.   Past Medical History:  Diagnosis Date  . Diabetes mellitus (HCC)   . HLD (hyperlipidemia)   . Hypothyroid      Past Surgical History:  Procedure Laterality Date  . CYST EXCISION     Face     Social History   Socioeconomic History  . Marital status: Legally Separated    Spouse name: Not on file  . Number of children: 6  . Years of education: Not on file  . Highest education level: Not on file  Occupational History  . Not on file  Social Needs  . Financial resource strain: Not on file  . Food insecurity    Worry: Not on file    Inability: Not on file  . Transportation needs    Medical: Not on file    Non-medical: Not on file  Tobacco Use  . Smoking status: Never Smoker  . Smokeless tobacco: Never Used  Substance and Sexual Activity  . Alcohol use: No  . Drug use: Never  . Sexual activity: Not on file  Lifestyle  . Physical activity    Days per week: Not on file    Minutes per session: Not on file  . Stress: Not on file  Relationships  . Social Musicianconnections    Talks on phone: Not on file    Gets together: Not on file    Attends religious service: Not on file    Active member of club or organization: Not on file    Attends meetings of clubs or organizations: Not on file    Relationship status: Not on file  . Intimate partner violence    Fear of current  or ex partner: Not on file    Emotionally abused: Not on file    Physically abused: Not on file    Forced sexual activity: Not on file  Other Topics Concern  . Not on file  Social History Narrative   Lives with daughter, son-in-law and grandsons. Retired from working at a school as a Art therapistcleaning part-time.      Current Outpatient Medications on File Prior to Visit  Medication Sig Dispense Refill  . aspirin EC 81 MG tablet Take 1 tablet (81 mg total) by mouth daily. 90 tablet 3  . furosemide (LASIX) 20 MG tablet Take 1 tablet (20 mg total) by mouth daily. 90 tablet 3  . ibuprofen (ADVIL,MOTRIN) 100 MG tablet Take 100 mg by mouth every 6 (six) hours as needed for fever.    . isosorbide mononitrate (IMDUR) 30 MG 24 hr tablet Take 1 tablet (30 mg total) by mouth daily. 30 tablet 3  . meloxicam (MOBIC) 15 MG tablet TAKE 1 TABLET BY MOUTH DAILY AS NEEDED FOR PAIN 30 tablet 0  . metFORMIN (GLUCOPHAGE) 1000 MG tablet Take 1 tablet (1,000 mg total) by mouth 2 (two) times daily with  a meal. 180 tablet 3  . neomycin-polymyxin-hydrocortisone (CORTISPORIN) OTIC solution Place 4 drops into the left ear 4 (four) times daily. 10 mL 0  . nitroGLYCERIN (NITROSTAT) 0.4 MG SL tablet Place 1 tablet (0.4 mg total) under the tongue every 5 (five) minutes as needed for chest pain. 30 tablet 3  . rosuvastatin (CRESTOR) 10 MG tablet Take 1 tablet (10 mg total) by mouth daily. 90 tablet 3   No current facility-administered medications on file prior to visit.     Cardiovascular studies:  Lexiscan Myoview stress test 02/27/2019: Lexiscan stress test was performed. Stress EKG is non-diagnostic, as this is pharmacological stress test. SPECT images reveal medium sized, moderate intensity, predominantly reversible perfusion defect in basal to apical, inferior/ inferolateral and basal inferoseptal myocardium, suggestive of small inferior infarct with large periinfarct ischemia in LCx/PDA territory. Stress LV EF is mildly  dysfunctional 49%., with mild inferolateral hypokinesis. High risk study.   Echocardiogram 10/19/2018: Normal LV systolic function with EF 60%. Left ventricle cavity is normal in size. Mild concentric hypertrophy of the left ventricle. Normal global wall motion. Doppler evidence of grade I (impaired) diastolic dysfunction, indeterminate LAP. Calculated EF 60%. Trileaflet aortic valve with mild aortic valve leaflet calcification. Mild aortic valve stenosis. Moderate (Grade II) aortic regurgitation. Aortic valve mean gradient of 12 mmHg, Vmax of 2.4 m/s. Calculated aortic valve area by continuity equation is 1.02 cm. Aortic valve area likely underestimated on Doppler. Moderate (Grade III) mitral regurgitation. Mild tricuspid regurgitation. Estimated pulmonary artery systolic pressure 24 mmHg.  EKG 01/26/2019: Sinus rhythm 74 bpm. Left anterior fascicular block. Nonspecific anterior T wave inversion.  EKG 08/04/2018: Sinus rhythm 67 bpm Left anterior fascicular block Nonspecific ST-T changes  Recent labs: Results for MAYDELIN, DEMING (MRN [id number]) as of 01/26/2019 16:55  Ref. Range 10/11/2018 09:15  Total CHOL/HDL Ratio Latest Ref Range: 0.0 - 4.4 ratio 6.2 (H)  Cholesterol, Total Latest Ref Range: 100 - 199 mg/dL 154  HDL Cholesterol Latest Ref Range: >39 mg/dL 25 (L)  LDL (calc) Latest Ref Range: 0 - 99 mg/dL 86  Triglycerides Latest Ref Range: 0 - 149 mg/dL 216 (H)  VLDL Cholesterol Cal Latest Ref Range: 5 - 40 mg/dL 43 (H)     Review of Systems  Constitution: Negative for decreased appetite, malaise/fatigue, weight gain and weight loss.  HENT: Negative for congestion.   Eyes: Negative for visual disturbance.  Cardiovascular: Positive for chest pain. Negative for dyspnea on exertion, leg swelling, palpitations and syncope.  Respiratory: Negative for shortness of breath.   Endocrine: Negative for cold intolerance.  Hematologic/Lymphatic: Does not bruise/bleed easily.  Skin:  Negative for itching and rash.  Musculoskeletal: Negative for myalgias.  Gastrointestinal: Negative for abdominal pain, nausea and vomiting.  Genitourinary: Negative for dysuria.  Neurological: Negative for dizziness and weakness.  Psychiatric/Behavioral: The patient is not nervous/anxious.   All other systems reviewed and are negative.       Vitals:   03/06/19 1518  BP: (!) 142/77  Pulse: 75  Temp: 97.7 F (36.5 C)  SpO2: 96%     Objective:   Physical Exam  Constitutional: She is oriented to person, place, and time. She appears well-developed and well-nourished. No distress.  HENT:  Head: Normocephalic and atraumatic.  Eyes: Pupils are equal, round, and reactive to light. Conjunctivae are normal.  Neck: No JVD present.  Cardiovascular: Normal rate, regular rhythm and intact distal pulses.  Murmur heard.  Blowing decrescendo early diastolic murmur is present with a grade of 2/6  at the upper right sternal border radiating to the apex. Pulmonary/Chest: Effort normal and breath sounds normal. She has no wheezes. She has no rales.  Abdominal: Soft. Bowel sounds are normal. There is no rebound.  Musculoskeletal:        General: No edema.  Lymphadenopathy:    She has no cervical adenopathy.  Neurological: She is alert and oriented to person, place, and time. No cranial nerve deficit.  Skin: Skin is warm and dry.  Psychiatric: She has a normal mood and affect.  Nursing note and vitals reviewed.         Assessment & Recommendations:   73 y.o. Hipanic female with mod MR, AI, hyperlipidemia, exertional chest pain, dyspnea, abnormal stress test.  Angina pectoris: Continues to have 1-2 episodes/week. Discussed stress test results with the patient. I discussed continued medical management versus invasive management with possible intervention.  Patient is unsure about going angiography at this time.  Added metoprolol succinate 25 mg daily.  Continue Imdur 30 mg daily, aspirin,  statin.  I will see her back in 4 weeks for further assessment and discussion.  Mod MR, mod AI: Continue symptomatic management with lasix.  Counseled re: salt restriction.  Hyperlipidemia: Continue Crestor 10 mg daily.   Elder Negus, MD [facility] Pager: [phone] Office: [phone] If no answer Cell [phone]

## 2019-03-14 ENCOUNTER — Telehealth: Payer: Self-pay

## 2019-03-14 ENCOUNTER — Other Ambulatory Visit: Payer: Self-pay | Admitting: Cardiology

## 2019-03-14 DIAGNOSIS — R079 Chest pain, unspecified: Secondary | ICD-10-CM

## 2019-03-14 NOTE — Telephone Encounter (Signed)
Pt daughter called In regards to pt still having chest pain. Mention pt is having more then before, Pt was given Metoprolol 25 mg, one ab daily. Please advise thank you.

## 2019-03-14 NOTE — Telephone Encounter (Signed)
Recommend taking SL NTG as needed. Please ask her if she would like to proceed with coronary angiography, as pain is not controlled with medications. If so, we can schedule this for next Tuesday 10/27. If she has chest pain not relieved with NTG, recommend calling 911 and going to ED-preferably [place].  Thanks MJP

## 2019-03-14 NOTE — Telephone Encounter (Signed)
Called pt to inform her about the message above. Pt daughter mention that she will proceed with the procedure on Tuesday.

## 2019-03-14 NOTE — Telephone Encounter (Signed)
Please arrange for coronary angiography and possible intervention on 10/27. I will put orders.  Thanks MJP

## 2019-03-14 NOTE — Telephone Encounter (Signed)
Look at this.

## 2019-03-16 ENCOUNTER — Other Ambulatory Visit (HOSPITAL_COMMUNITY): Payer: Self-pay | Admitting: Cardiology

## 2019-03-16 ENCOUNTER — Ambulatory Visit: Payer: Medicare Other | Admitting: Cardiology

## 2019-03-17 ENCOUNTER — Other Ambulatory Visit (HOSPITAL_COMMUNITY)
Admission: RE | Admit: 2019-03-17 | Discharge: 2019-03-17 | Disposition: A | Discharge status: Home / Self Care | Payer: Medicare Other | Source: Ambulatory Visit | Attending: Cardiology | Admitting: Cardiology

## 2019-03-17 DIAGNOSIS — Z20828 Contact with and (suspected) exposure to other viral communicable diseases: Secondary | ICD-10-CM | POA: Insufficient documentation

## 2019-03-17 DIAGNOSIS — Z01812 Encounter for preprocedural laboratory examination: Secondary | ICD-10-CM | POA: Insufficient documentation

## 2019-03-17 LAB — BASIC METABOLIC PANEL
BUN/Creatinine Ratio: 22 (ref 12–28)
BUN: 14 mg/dL (ref 8–27)
CO2: 24 mmol/L (ref 20–29)
Calcium: 9.6 mg/dL (ref 8.7–10.3)
Chloride: 105 mmol/L (ref 96–106)
Creatinine, Ser: 0.64 mg/dL (ref 0.57–1.00)
GFR calc Af Amer: 104 mL/min/{1.73_m2} (ref >59)
GFR calc non Af Amer: 90 mL/min/{1.73_m2} (ref >59)
Glucose: 78 mg/dL (ref 65–99)
Potassium: 4 mmol/L (ref 3.5–5.2)
Sodium: 141 mmol/L (ref 134–144)

## 2019-03-17 LAB — CBC
Hematocrit: 40.1 % (ref 34.0–46.6)
Hemoglobin: 12.9 g/dL (ref 11.1–15.9)
MCH: 28.7 pg (ref 26.6–33.0)
MCHC: 32.2 g/dL (ref 31.5–35.7)
MCV: 89 fL (ref 79–97)
Platelets: 286 10*3/uL (ref 150–450)
RBC: 4.49 x10E6/uL (ref 3.77–5.28)
RDW: 13.7 % (ref 11.7–15.4)
WBC: 9.1 10*3/uL (ref 3.4–10.8)

## 2019-03-18 LAB — NOVEL CORONAVIRUS, NAA (HOSP ORDER, SEND-OUT TO REF LAB; TAT 18-24 HRS): SARS-CoV-2, NAA: NOT DETECTED

## 2019-03-20 ENCOUNTER — Ambulatory Visit (INDEPENDENT_AMBULATORY_CARE_PROVIDER_SITE_OTHER): Payer: Medicare Other | Admitting: Family Medicine

## 2019-03-20 ENCOUNTER — Ambulatory Visit (HOSPITAL_COMMUNITY)
Admission: RE | Admit: 2019-03-20 | Discharge: 2019-03-20 | Disposition: A | Discharge status: Home / Self Care | Payer: Medicare Other | Source: Ambulatory Visit

## 2019-03-20 ENCOUNTER — Other Ambulatory Visit: Payer: Self-pay

## 2019-03-20 ENCOUNTER — Observation Stay (HOSPITAL_COMMUNITY)
Admission: EM | Admit: 2019-03-20 | Discharge: 2019-03-21 | Disposition: A | Discharge status: Home / Self Care | Payer: Medicare Other | Attending: Cardiology | Admitting: Cardiology

## 2019-03-20 ENCOUNTER — Encounter (HOSPITAL_COMMUNITY): Payer: Self-pay

## 2019-03-20 ENCOUNTER — Encounter: Payer: Self-pay | Admitting: Family Medicine

## 2019-03-20 ENCOUNTER — Emergency Department (HOSPITAL_COMMUNITY): Payer: Medicare Other

## 2019-03-20 ENCOUNTER — Ambulatory Visit (HOSPITAL_COMMUNITY)
Admission: RE | Admit: 2019-03-20 | Discharge: 2019-03-20 | Disposition: A | Discharge status: Home / Self Care | Payer: Medicare Other | Source: Ambulatory Visit | Attending: Family Medicine | Admitting: Family Medicine

## 2019-03-20 VITALS — BP 110/68 | HR 64 | Wt 136.6 lb

## 2019-03-20 DIAGNOSIS — Z7982 Long term (current) use of aspirin: Secondary | ICD-10-CM | POA: Insufficient documentation

## 2019-03-20 DIAGNOSIS — E782 Mixed hyperlipidemia: Secondary | ICD-10-CM | POA: Insufficient documentation

## 2019-03-20 DIAGNOSIS — E1142 Type 2 diabetes mellitus with diabetic polyneuropathy: Secondary | ICD-10-CM

## 2019-03-20 DIAGNOSIS — R110 Nausea: Secondary | ICD-10-CM | POA: Insufficient documentation

## 2019-03-20 DIAGNOSIS — I340 Nonrheumatic mitral (valve) insufficiency: Secondary | ICD-10-CM | POA: Insufficient documentation

## 2019-03-20 DIAGNOSIS — Z79899 Other long term (current) drug therapy: Secondary | ICD-10-CM | POA: Diagnosis not present

## 2019-03-20 DIAGNOSIS — R079 Chest pain, unspecified: Secondary | ICD-10-CM | POA: Diagnosis not present

## 2019-03-20 DIAGNOSIS — Z1211 Encounter for screening for malignant neoplasm of colon: Secondary | ICD-10-CM

## 2019-03-20 DIAGNOSIS — Z1239 Encounter for other screening for malignant neoplasm of breast: Secondary | ICD-10-CM

## 2019-03-20 DIAGNOSIS — Z7984 Long term (current) use of oral hypoglycemic drugs: Secondary | ICD-10-CM | POA: Insufficient documentation

## 2019-03-20 DIAGNOSIS — I209 Angina pectoris, unspecified: Secondary | ICD-10-CM | POA: Insufficient documentation

## 2019-03-20 DIAGNOSIS — R0789 Other chest pain: Principal | ICD-10-CM | POA: Insufficient documentation

## 2019-03-20 DIAGNOSIS — E039 Hypothyroidism, unspecified: Secondary | ICD-10-CM | POA: Diagnosis not present

## 2019-03-20 DIAGNOSIS — Z1231 Encounter for screening mammogram for malignant neoplasm of breast: Secondary | ICD-10-CM | POA: Insufficient documentation

## 2019-03-20 DIAGNOSIS — R0602 Shortness of breath: Secondary | ICD-10-CM | POA: Diagnosis not present

## 2019-03-20 DIAGNOSIS — Z20828 Contact with and (suspected) exposure to other viral communicable diseases: Secondary | ICD-10-CM | POA: Insufficient documentation

## 2019-03-20 DIAGNOSIS — E119 Type 2 diabetes mellitus without complications: Secondary | ICD-10-CM | POA: Insufficient documentation

## 2019-03-20 LAB — CBC WITH DIFFERENTIAL/PLATELET
Abs Immature Granulocytes: 0.02 10*3/uL (ref 0.00–0.07)
Basophils Absolute: 0.1 10*3/uL (ref 0.0–0.1)
Basophils Relative: 1 %
Eosinophils Absolute: 0.4 10*3/uL (ref 0.0–0.5)
Eosinophils Relative: 5 %
HCT: 41 % (ref 36.0–46.0)
Hemoglobin: 13.3 g/dL (ref 12.0–15.0)
Immature Granulocytes: 0 %
Lymphocytes Relative: 33 %
Lymphs Abs: 2.7 10*3/uL (ref 0.7–4.0)
MCH: 29.2 pg (ref 26.0–34.0)
MCHC: 32.4 g/dL (ref 30.0–36.0)
MCV: 89.9 fL (ref 80.0–100.0)
Monocytes Absolute: 0.6 10*3/uL (ref 0.1–1.0)
Monocytes Relative: 7 %
Neutro Abs: 4.4 10*3/uL (ref 1.7–7.7)
Neutrophils Relative %: 54 %
Platelets: 276 10*3/uL (ref 150–400)
RBC: 4.56 MIL/uL (ref 3.87–5.11)
RDW: 13.3 % (ref 11.5–15.5)
WBC: 8.2 10*3/uL (ref 4.0–10.5)
nRBC: 0 % (ref 0.0–0.2)

## 2019-03-20 LAB — SARS CORONAVIRUS 2 (TAT 6-24 HRS): SARS Coronavirus 2: NEGATIVE

## 2019-03-20 LAB — BASIC METABOLIC PANEL
Anion gap: 11 (ref 5–15)
BUN: 10 mg/dL (ref 8–23)
CO2: 22 mmol/L (ref 22–32)
Calcium: 9.2 mg/dL (ref 8.9–10.3)
Chloride: 105 mmol/L (ref 98–111)
Creatinine, Ser: 0.65 mg/dL (ref 0.44–1.00)
GFR calc Af Amer: >60 mL/min (ref >60)
GFR calc non Af Amer: >60 mL/min (ref >60)
Glucose, Bld: 106 mg/dL — ABNORMAL HIGH (ref 70–99)
Potassium: 4.1 mmol/L (ref 3.5–5.1)
Sodium: 138 mmol/L (ref 135–145)

## 2019-03-20 LAB — TROPONIN I (HIGH SENSITIVITY)
Troponin I (High Sensitivity): 3 ng/L (ref <18)
Troponin I (High Sensitivity): 3 ng/L (ref <18)

## 2019-03-20 LAB — GLUCOSE, CAPILLARY: Glucose-Capillary: 95 mg/dL (ref 70–99)

## 2019-03-20 MED ORDER — NITROGLYCERIN 0.4 MG SL SUBL
0.4000 mg | SUBLINGUAL_TABLET | SUBLINGUAL | Status: DC | PRN
  Filled 2019-03-20: qty 1

## 2019-03-20 MED ORDER — ONDANSETRON HCL 4 MG/2ML IJ SOLN: 4.0000 mg | Freq: Four times a day (QID) | INTRAMUSCULAR | Status: DC | PRN

## 2019-03-20 MED ORDER — ACETAMINOPHEN 325 MG PO TABS: 650.0000 mg | ORAL_TABLET | ORAL | Status: DC | PRN

## 2019-03-20 MED ORDER — ISOSORBIDE MONONITRATE ER 30 MG PO TB24
30.0000 mg | ORAL_TABLET | Freq: Every day | ORAL | Status: DC
  Administered 2019-03-21: 30 mg via ORAL
  Filled 2019-03-20: qty 1

## 2019-03-20 MED ORDER — FUROSEMIDE 20 MG PO TABS
20.0000 mg | ORAL_TABLET | Freq: Every day | ORAL | Status: DC
  Administered 2019-03-21: 20 mg via ORAL
  Filled 2019-03-20: qty 1

## 2019-03-20 MED ORDER — ASPIRIN 325 MG PO TABS
325.0000 mg | ORAL_TABLET | Freq: Once | ORAL | Status: AC
  Administered 2019-03-20: 325 mg via ORAL

## 2019-03-20 MED ORDER — SODIUM CHLORIDE 0.9 % IV BOLUS
1000.0000 mL | Freq: Once | INTRAVENOUS | Status: AC
  Administered 2019-03-20: 1000 mL via INTRAVENOUS

## 2019-03-20 MED ORDER — HEPARIN BOLUS VIA INFUSION
3500.0000 [IU] | Freq: Once | INTRAVENOUS | Status: AC
  Administered 2019-03-20: 3500 [IU] via INTRAVENOUS
  Filled 2019-03-20: qty 3500

## 2019-03-20 MED ORDER — ASPIRIN EC 81 MG PO TBEC
81.0000 mg | DELAYED_RELEASE_TABLET | Freq: Every day | ORAL | Status: DC
  Administered 2019-03-21: 81 mg via ORAL
  Filled 2019-03-20: qty 1

## 2019-03-20 MED ORDER — HEPARIN SODIUM (PORCINE) 5000 UNIT/ML IJ SOLN: 5000.0000 [IU] | Freq: Three times a day (TID) | INTRAMUSCULAR | Status: DC

## 2019-03-20 MED ORDER — ASPIRIN EC 81 MG PO TBEC: 81.0000 mg | DELAYED_RELEASE_TABLET | Freq: Every day | ORAL | Status: DC

## 2019-03-20 MED ORDER — ROSUVASTATIN CALCIUM 5 MG PO TABS
10.0000 mg | ORAL_TABLET | Freq: Every day | ORAL | Status: DC
  Administered 2019-03-21: 10 mg via ORAL
  Filled 2019-03-20: qty 2

## 2019-03-20 MED ORDER — HEPARIN (PORCINE) 25000 UT/250ML-% IV SOLN
900.0000 [IU]/h | INTRAVENOUS | Status: DC
  Administered 2019-03-20: 750 [IU]/h via INTRAVENOUS
  Filled 2019-03-20: qty 250

## 2019-03-20 MED ORDER — METOPROLOL SUCCINATE ER 25 MG PO TB24
25.0000 mg | ORAL_TABLET | Freq: Every day | ORAL | Status: DC
  Administered 2019-03-21: 25 mg via ORAL
  Filled 2019-03-20: qty 1

## 2019-03-20 MED ORDER — INSULIN ASPART 100 UNIT/ML ~~LOC~~ SOLN
0.0000 [IU] | Freq: Three times a day (TID) | SUBCUTANEOUS | Status: DC
  Administered 2019-03-21: 2 [IU] via SUBCUTANEOUS

## 2019-03-20 NOTE — H&P (View-Only) (Signed)
I discussed with Dr. Scott Goldston, patient asymptomatic and resting comfortably without recurrence of chest pain, no EKG abnormalities,- I since 2 serum troponin negative.  Patient is scheduled for cardiac catheterization tomorrow, hence will admit the patient wants evaluation and proceed with cardiac catheterization tomorrow as planned. 2nd set of high-sensitivity serum troponin pending.  EKG 03/20/2019: Normal sinus rhythm at rate of 72 bpm, T-wave inversion anterior leads, cannot exclude ischemia.  Compared to 01/26/2019, no significant change in T-wave abnormality. 

## 2019-03-20 NOTE — Progress Notes (Signed)
ANTICOAGULATION CONSULT NOTE - Initial Consult  Pharmacy Consult for Heparin Indication: chest pain/ACS  Allergies  Allergen Reactions  . Almond (Diagnostic) Itching and Swelling    In the eyes    Patient Measurements: Weight: 140 lb (63.5 kg)  Vital Signs: Temp: 97.7 F (36.5 C) (10/26 1718) Temp Source: Oral (10/26 1718) BP: 114/72 (10/26 1915) Pulse Rate: 73 (10/26 1915)  Labs: Recent Labs    03/20/19 1705  HGB 13.3  HCT 41.0  PLT 276  CREATININE 0.65  TROPONINIHS 3    Estimated Creatinine Clearance: 52.9 mL/min (by C-G formula based on SCr of 0.65 mg/dL).   Medical History: Past Medical History:  Diagnosis Date  . Diabetes mellitus ([place])   . HLD (hyperlipidemia)   . Hypothyroid     Assessment: 73 year old female who presented to ED with ACS. Pharmacy consulted to start IV Heparin therapy. CBC is within normal limits. SCr within normal limits. Plan for cardiac cath in AM.    Goal of Therapy:  Heparin level 0.3-0.7 units/ml Monitor platelets by anticoagulation protocol: Yes   Plan:  Heparin bolus 3500 units. Heparin drip at 750 units/hr.  Heparin level in 8 hours.  Daily Heparin level and CBC while on therapy.  F/up cath  Sloan Leiter, PharmD, BCPS, BCCCP Clinical Pharmacist Clinical phone 03/20/2019 until 10:30P [phone] Please refer to [facility] for [employer] numbers 03/20/2019,8:03 PM

## 2019-03-20 NOTE — Progress Notes (Signed)
Called patient's daughter Kathyrn Drown), udated her mother's status in [place], her location, and the time she will be having the cath procedure tomorrow.  Let Izora Gala know that she can visit her mother tomorrow between visiting hours.  Nancy understood.

## 2019-03-20 NOTE — Progress Notes (Signed)
  Subjective  CC: Annual Exam  Brittany Stout is a 73 y.o. female who presents today with the following problems: Annual physical Chest pain Patient reports that she has been having worsening chest pain over the last 2 days.  She reports that yesterday, she had to take nitro on 5 different occasions.  She took a total of 5 tablets.  Today, she has taken 3 tabs of nitro but does report having to take a second after the first as it did not help with the pain.  Patient reports that the pain radiates to the right side of her chest.  Patient was scheduled for a cath tomorrow.  Patient reports that she has chest pain at rest.  She also reports that she has shortness of breath with the chest pain.  She did take aspirin 81 mg this morning.  The last nitro she took was at 11:00 this morning, though she also reports that she took a nitro prior to arrival.  Pertinent P/F/SHx: angina, Aortic insufficiency, mitral insufficiency, diabetes, hypothyroidism ROS: Pertinent ROS included in HPI. Objective  Physical Exam:  BP 110/68   Pulse 64   Wt 136 lb 9.6 oz (62 kg)   SpO2 98%   BMI 26.68 kg/m  General: Well-appearing older Hispanic female in no acute distress Chest: No pain with palpation to chest wall.  Regular rate and rhythm. 1/6 systolic murmur.  Radial pulses 1+.  DPs 1+. Lungs: CTAB, mild crackles on posterior bases.  No active shortness of breath. Extremities: no BLEE   Pertinent Labs:  EKG shows no significant change Assessment & Plan    Problem List Items Addressed This Visit      Cardiovascular and Mediastinum   Angina pectoris [facility])    Patient having worsening chest pain over the last 24 hours.  She is required 8 nitro tabs.  Patient is scheduled for cath tomorrow.  Given her worsening and frequent chest pain, patient is agreeable to go to the emergency department at this time for further evaluation and labs.  I have called an ambulance to emergently transfer her to the emergency  department.  Patient was provided 325 mg of aspirin.  She does report taking her aspirin 81 this morning.  The last time she took her nitroglycerin was at 11:00 this morning.  Her vitals are within normal limits at this time.        Endocrine   DM (diabetes mellitus) ([place])    Other Visit Diagnoses    Chest pain, unspecified type    -  Primary   Relevant Medications   aspirin tablet 325 mg (Completed)   Other Relevant Orders   EKG 12-Lead (Completed)   Encounter for screening colonoscopy       Breast screening       Encounter for screening mammogram for malignant neoplasm of breast          Health Maintenance Due  Topic Date Due  . Hepatitis C Screening  01/16/1946  . OPHTHALMOLOGY EXAM  03/16/1957  . TETANUS/TDAP  03/16/1966  . COLONOSCOPY  03/16/1997  . FOOT EXAM  03/03/2018  . URINE MICROALBUMIN  03/03/2018  . PNA vac Low Risk Adult (2 of 2 - PPSV23) 03/17/2018   Given patient's chest pain today, will defer remainder of health maintenance and annual physical for another time.  [name], M.D.  PGY-2  Family Medicine  [phone] 03/20/2019 4:39 PM

## 2019-03-20 NOTE — ED Provider Notes (Signed)
[facility] EMERGENCY DEPARTMENT Provider Note   CSN: [id number] Arrival date & time: 03/20/19  1700     History   Chief Complaint Chief Complaint  Patient presents with  . Chest Pain    HPI Brittany Stout is a 73 y.o. female.     HPI  73 year old female presents with chest pain.  Sent in from her clinic at family medicine.  The patient has been having on and off chest pain for about 2 or 3 days.  Has been taking nitroglycerin with partial relief.  Still has some pain though it is improved after taking nitro.  Was given aspirin by EMS.  The patient denies any leg swelling.  She gets short of breath with this and sometimes nausea.  No diaphoresis or vomiting.  Radiates from her left breast throughout her chest. Worse with certain positions such as laying flat. Is due for a catheterization tomorrow.  Past Medical History:  Diagnosis Date  . Diabetes mellitus (HCC)   . HLD (hyperlipidemia)   . Hypothyroid     Patient Active Problem List   Diagnosis Date Noted  . Otitis externa of left ear 02/01/2019  . Angina pectoris (HCC) 01/26/2019  . Leg edema, left 10/30/2018  . Moderate mitral insufficiency 10/30/2018  . Moderate aortic insufficiency 10/30/2018  . Systolic murmur 08/04/2018  . Exertional chest pain 08/04/2018  . Bilateral chronic knee pain 03/05/2017  . Foot pain 06/23/2012  . HLD (hyperlipidemia) 02/25/2012  . DM (diabetes mellitus) (HCC) 02/25/2012  . Hypothyroidism 02/25/2012    Past Surgical History:  Procedure Laterality Date  . CYST EXCISION     Face     OB History   No obstetric history on file.    Obstetric Comments  6 vaginal births         Home Medications    Prior to Admission medications   Medication Sig Start Date End Date Taking? Authorizing Provider  aspirin EC 81 MG tablet Take 1 tablet (81 mg total) by mouth daily. 08/04/18   Patwardhan, Anabel Bene, MD  furosemide (LASIX) 20 MG tablet Take 1 tablet (20 mg total)  by mouth daily. Patient not taking: Reported on 03/20/2019 10/27/18 03/06/19  Elder Negus, MD  Ibuprofen 200 MG CAPS Take 200 mg by mouth every 6 (six) hours as needed (pain).     [provider]  isosorbide mononitrate (IMDUR) 30 MG 24 hr tablet Take 1 tablet (30 mg total) by mouth daily. Patient taking differently: Take 30 mg by mouth daily at 12 noon.  01/26/19 04/26/19  Patwardhan, Anabel Bene, MD  meloxicam (MOBIC) 15 MG tablet TAKE 1 TABLET BY MOUTH DAILY AS NEEDED FOR PAIN Patient not taking: Reported on 03/20/2019 08/17/17   Arvilla Market, DO  metFORMIN (GLUCOPHAGE) 1000 MG tablet Take 1 tablet (1,000 mg total) by mouth 2 (two) times daily with a meal. 04/18/18   Melene Plan, MD  metoprolol succinate (TOPROL XL) 25 MG 24 hr tablet Take 1 tablet (25 mg total) by mouth daily. 03/06/19   Patwardhan, Anabel Bene, MD  neomycin-polymyxin-hydrocortisone (CORTISPORIN) OTIC solution Place 4 drops into the left ear 4 (four) times daily. Patient not taking: Reported on 03/20/2019 02/01/19   Allayne Stack, DO  nitroGLYCERIN (NITROSTAT) 0.4 MG SL tablet Place 1 tablet (0.4 mg total) under the tongue every 5 (five) minutes as needed for chest pain. 08/04/18 03/20/19  Patwardhan, Anabel Bene, MD  rosuvastatin (CRESTOR) 10 MG tablet Take 1 tablet (10  mg total) by mouth daily. 10/27/18 03/20/19  Elder NegusPatwardhan, Manish J, MD    Family History Family History  Problem Relation Age of Onset  . Breast cancer Neg Hx     Social History Social History   Tobacco Use  . Smoking status: Never Smoker  . Smokeless tobacco: Never Used  Substance Use Topics  . Alcohol use: No  . Drug use: Never     Allergies   Almond (diagnostic)   Review of Systems Review of Systems  Constitutional: Negative for diaphoresis and fever.  Respiratory: Positive for shortness of breath. Negative for cough.   Cardiovascular: Positive for chest pain. Negative for leg swelling.  Gastrointestinal: Positive for  nausea. Negative for vomiting.  All other systems reviewed and are negative.    Physical Exam Updated Vital Signs BP 115/82   Pulse 77   Temp 97.7 F (36.5 C) (Oral)   Resp 17   Wt 63.5 kg   SpO2 96%   BMI 27.34 kg/m   Physical Exam Vitals signs and nursing note reviewed.  Constitutional:      Appearance: She is well-developed.  HENT:     Head: Normocephalic and atraumatic.     Right Ear: External ear normal.     Left Ear: External ear normal.     Nose: Nose normal.  Eyes:     General:        Right eye: No discharge.        Left eye: No discharge.  Cardiovascular:     Rate and Rhythm: Normal rate and regular rhythm.     Heart sounds: Normal heart sounds.  Pulmonary:     Effort: Pulmonary effort is normal.     Breath sounds: Normal breath sounds.  Chest:     Chest wall: Tenderness present.  Abdominal:     Palpations: Abdomen is soft.     Tenderness: There is no abdominal tenderness.  Musculoskeletal:     Right lower leg: No edema.     Left lower leg: No edema.  Skin:    General: Skin is warm and dry.  Neurological:     Mental Status: She is alert.  Psychiatric:        Mood and Affect: Mood is not anxious.      ED Treatments / Results  Labs (all labs ordered are listed, but only abnormal results are displayed) Labs Reviewed  BASIC METABOLIC PANEL - Abnormal; Notable for the following components:      Result Value   Glucose, Bld 106 (*)    All other components within normal limits  SARS CORONAVIRUS 2 (TAT 6-24 HRS)  CBC WITH DIFFERENTIAL/PLATELET  TROPONIN I (HIGH SENSITIVITY)  TROPONIN I (HIGH SENSITIVITY)    EKG None  Radiology Dg Chest Portable 1 View  Result Date: 03/20/2019 CLINICAL DATA:  73 year old presenting with acute chest pain. EXAM: PORTABLE CHEST 1 VIEW COMPARISON:  None. FINDINGS: Markedly suboptimal inspiration which accounts for atelectasis in the lung bases. This also extension weights the heart size which is likely upper  normal to mildly enlarged. Lungs otherwise clear. Pulmonary vascularity normal without evidence of pulmonary edema. IMPRESSION: 1. Markedly suboptimal inspiration accounts for bibasilar atelectasis. No acute cardiopulmonary disease otherwise. 2. Borderline to mild cardiomegaly without pulmonary edema. Electronically Signed   By: Hulan Saashomas  Lawrence M.D.   On: 03/20/2019 17:49    Procedures Procedures (including critical care time)  Medications Ordered in ED Medications  nitroGLYCERIN (NITROSTAT) SL tablet 0.4 mg (has no administration in time  range)     Initial Impression / Assessment and Plan / ED Course  I have reviewed the triage vital signs and the nursing notes.  Pertinent labs & imaging results that were available during my care of the patient were reviewed by me and considered in my medical decision making (see chart for details).        Initial troponin and lab work is benign.  Chest x-ray appears clear.  ECG shows some T wave inversions but unchanged from earlier in the day.  She does have multiple cardiac risk factors and given she is due for cardiac cath tomorrow, I think it is reasonable to admit her overnight and cycle troponins.  Discussed with Dr. Einar Gip who will admit.  Kyera Felan was evaluated in Emergency Department on 03/20/2019 for the symptoms described in the history of present illness. She was evaluated in the context of the global COVID-19 pandemic, which necessitated consideration that the patient might be at risk for infection with the SARS-CoV-2 virus that causes COVID-19. Institutional protocols and algorithms that pertain to the evaluation of patients at risk for COVID-19 are in a state of rapid change based on information released by regulatory bodies including the CDC and federal and state organizations. These policies and algorithms were followed during the patient's care in the ED.   Final Clinical Impressions(s) / ED Diagnoses   Final diagnoses:   Atypical chest pain    ED Discharge Orders    None       Sherwood Gambler, MD 03/20/19 1900

## 2019-03-20 NOTE — ED Triage Notes (Signed)
Pt BIB [facility] for chest pain. She was sent from her physician, received 2 nitro and 325 of aspirin per EMS. Patient is scheduled for catheterization tomorrow.

## 2019-03-20 NOTE — Assessment & Plan Note (Signed)
Patient having worsening chest pain over the last 24 hours.  She is required 8 nitro tabs.  Patient is scheduled for cath tomorrow.  Given her worsening and frequent chest pain, patient is agreeable to go to the emergency department at this time for further evaluation and labs.  I have called an ambulance to emergently transfer her to the emergency department.  Patient was provided 325 mg of aspirin.  She does report taking her aspirin 81 this morning.  The last time she took her nitroglycerin was at 11:00 this morning.  Her vitals are within normal limits at this time.

## 2019-03-20 NOTE — Progress Notes (Signed)
I discussed with Dr. Sherwood Gambler, patient asymptomatic and resting comfortably without recurrence of chest pain, no EKG abnormalities,- I since 2 serum troponin negative.  Patient is scheduled for cardiac catheterization tomorrow, hence will admit the patient wants evaluation and proceed with cardiac catheterization tomorrow as planned. 2nd set of high-sensitivity serum troponin pending.  EKG 03/20/2019: Normal sinus rhythm at rate of 72 bpm, T-wave inversion anterior leads, cannot exclude ischemia.  Compared to 01/26/2019, no significant change in T-wave abnormality.

## 2019-03-21 ENCOUNTER — Encounter (HOSPITAL_COMMUNITY)
Admission: EM | Disposition: A | Discharge status: Home / Self Care | Payer: Self-pay | Source: Home / Self Care | Attending: Emergency Medicine

## 2019-03-21 ENCOUNTER — Other Ambulatory Visit: Payer: Self-pay

## 2019-03-21 ENCOUNTER — Ambulatory Visit (HOSPITAL_COMMUNITY): Admission: RE | Admit: 2019-03-21 | Payer: Medicare Other | Source: Home / Self Care | Admitting: Cardiology

## 2019-03-21 DIAGNOSIS — E039 Hypothyroidism, unspecified: Secondary | ICD-10-CM | POA: Diagnosis not present

## 2019-03-21 DIAGNOSIS — I209 Angina pectoris, unspecified: Secondary | ICD-10-CM

## 2019-03-21 DIAGNOSIS — R0789 Other chest pain: Secondary | ICD-10-CM | POA: Diagnosis not present

## 2019-03-21 DIAGNOSIS — E119 Type 2 diabetes mellitus without complications: Secondary | ICD-10-CM | POA: Diagnosis not present

## 2019-03-21 DIAGNOSIS — R0602 Shortness of breath: Secondary | ICD-10-CM | POA: Diagnosis not present

## 2019-03-21 HISTORY — PX: LEFT HEART CATH AND CORONARY ANGIOGRAPHY: CATH118249

## 2019-03-21 LAB — GLUCOSE, CAPILLARY
Glucose-Capillary: 90 mg/dL (ref 70–99)
Glucose-Capillary: 122 mg/dL — ABNORMAL HIGH (ref 70–99)
Glucose-Capillary: 106 mg/dL — ABNORMAL HIGH (ref 70–99)

## 2019-03-21 LAB — CBC
HCT: 38 % (ref 36.0–46.0)
Hemoglobin: 12.5 g/dL (ref 12.0–15.0)
MCH: 29.2 pg (ref 26.0–34.0)
MCHC: 32.9 g/dL (ref 30.0–36.0)
MCV: 88.8 fL (ref 80.0–100.0)
Platelets: 262 10*3/uL (ref 150–400)
RBC: 4.28 MIL/uL (ref 3.87–5.11)
RDW: 13.4 % (ref 11.5–15.5)
WBC: 7.3 10*3/uL (ref 4.0–10.5)
nRBC: 0 % (ref 0.0–0.2)

## 2019-03-21 LAB — HEPARIN LEVEL (UNFRACTIONATED)
Heparin Unfractionated: <0.1 IU/mL — ABNORMAL LOW (ref 0.30–0.70)
Heparin Unfractionated: 0.1 IU/mL — ABNORMAL LOW (ref 0.30–0.70)

## 2019-03-21 SURGERY — LEFT HEART CATH AND CORONARY ANGIOGRAPHY
Anesthesia: LOCAL

## 2019-03-21 MED ORDER — FENTANYL CITRATE (PF) 100 MCG/2ML IJ SOLN
INTRAMUSCULAR | Status: DC | PRN
  Administered 2019-03-21: 25 ug via INTRAVENOUS

## 2019-03-21 MED ORDER — SODIUM CHLORIDE 0.9 % IV SOLN: 250.0000 mL | INTRAVENOUS | Status: DC | PRN

## 2019-03-21 MED ORDER — MIDAZOLAM HCL 2 MG/2ML IJ SOLN
INTRAMUSCULAR | Status: DC | PRN
  Administered 2019-03-21: 1 mg via INTRAVENOUS

## 2019-03-21 MED ORDER — SODIUM CHLORIDE 0.9 % IV SOLN: INTRAVENOUS | Status: DC

## 2019-03-21 MED ORDER — SODIUM CHLORIDE 0.9% FLUSH: 3.0000 mL | INTRAVENOUS | Status: DC | PRN

## 2019-03-21 MED ORDER — SODIUM CHLORIDE 0.9% FLUSH: 3.0000 mL | Freq: Two times a day (BID) | INTRAVENOUS | Status: DC

## 2019-03-21 MED ORDER — HEPARIN BOLUS VIA INFUSION
2000.0000 [IU] | Freq: Once | INTRAVENOUS | Status: AC
  Administered 2019-03-21: 2000 [IU] via INTRAVENOUS
  Filled 2019-03-21: qty 2000

## 2019-03-21 MED ORDER — HEPARIN (PORCINE) IN NACL 1000-0.9 UT/500ML-% IV SOLN
INTRAVENOUS | Status: DC | PRN
  Administered 2019-03-21 (×2): 500 mL

## 2019-03-21 MED ORDER — HYDRALAZINE HCL 20 MG/ML IJ SOLN: 10.0000 mg | INTRAMUSCULAR | Status: DC | PRN

## 2019-03-21 MED ORDER — SODIUM CHLORIDE 0.9 % WEIGHT BASED INFUSION
1.0000 mL/kg/h | INTRAVENOUS | Status: DC
  Administered 2019-03-21: 06:00:00 1 mL/kg/h via INTRAVENOUS

## 2019-03-21 MED ORDER — HEPARIN SODIUM (PORCINE) 1000 UNIT/ML IJ SOLN
INTRAMUSCULAR | Status: DC | PRN
  Administered 2019-03-21: 3500 [IU] via INTRAVENOUS

## 2019-03-21 MED ORDER — LIDOCAINE HCL (PF) 1 % IJ SOLN
INTRAMUSCULAR | Status: DC | PRN
  Administered 2019-03-21: 2 mL via INTRADERMAL

## 2019-03-21 MED ORDER — LABETALOL HCL 5 MG/ML IV SOLN: 10.0000 mg | INTRAVENOUS | Status: DC | PRN

## 2019-03-21 MED ORDER — SODIUM CHLORIDE 0.9 % WEIGHT BASED INFUSION: 3.0000 mL/kg/h | INTRAVENOUS | Status: AC

## 2019-03-21 MED ORDER — ONDANSETRON HCL 4 MG/2ML IJ SOLN: 4.0000 mg | Freq: Four times a day (QID) | INTRAMUSCULAR | Status: DC | PRN

## 2019-03-21 MED ORDER — VERAPAMIL HCL 2.5 MG/ML IV SOLN
INTRAVENOUS | Status: DC | PRN
  Administered 2019-03-21: 15:00:00 10 mL via INTRA_ARTERIAL

## 2019-03-21 MED ORDER — ACETAMINOPHEN 325 MG PO TABS: 650.0000 mg | ORAL_TABLET | ORAL | Status: DC | PRN

## 2019-03-21 MED ORDER — ASPIRIN 81 MG PO CHEW
81.0000 mg | CHEWABLE_TABLET | ORAL | Status: AC
  Administered 2019-03-21: 81 mg via ORAL
  Filled 2019-03-21: qty 1

## 2019-03-21 MED ORDER — IOHEXOL 350 MG/ML SOLN
INTRAVENOUS | Status: DC | PRN
  Administered 2019-03-21: 30 mL via INTRA_ARTERIAL

## 2019-03-21 SURGICAL SUPPLY — 9 items
CATH OPTITORQUE TIG 4.0 5F (CATHETERS) ×1 IMPLANT
DEVICE RAD COMP TR BAND LRG (VASCULAR PRODUCTS) ×1 IMPLANT
GLIDESHEATH SLEND A-KIT 6F 22G (SHEATH) ×1 IMPLANT
GUIDEWIRE INQWIRE 1.5J.035X260 (WIRE) IMPLANT
INQWIRE 1.5J .035X260CM (WIRE) ×2
KIT HEART LEFT (KITS) ×2 IMPLANT
PACK CARDIAC CATHETERIZATION (CUSTOM PROCEDURE TRAY) ×2 IMPLANT
TRANSDUCER W/STOPCOCK (MISCELLANEOUS) ×2 IMPLANT
TUBING CIL FLEX 10 FLL-RA (TUBING) ×2 IMPLANT

## 2019-03-21 NOTE — Progress Notes (Signed)
[place] for Heparin Indication: chest pain/ACS  Allergies  Allergen Reactions  . Almond (Diagnostic) Itching and Swelling    In the eyes    Patient Measurements: Height: 5' (152.4 cm) Weight: 140 lb (63.5 kg) IBW/kg (Calculated) : 45.5  Vital Signs: Temp: 98 F (36.7 C) (10/26 2143) Temp Source: Oral (10/26 2143) BP: 133/77 (10/26 2143) Pulse Rate: 68 (10/26 2143)  Labs: Recent Labs    03/20/19 1705 03/20/19 2145 03/21/19 0415  HGB 13.3  --  12.5  HCT 41.0  --  38.0  PLT 276  --  262  HEPARINUNFRC  --   --  <0.10*  CREATININE 0.65  --   --   TROPONINIHS 3 3  --     Estimated Creatinine Clearance: 52.9 mL/min (by C-G formula based on SCr of 0.65 mg/dL).   Medical History: Past Medical History:  Diagnosis Date  . Diabetes mellitus ([place])   . HLD (hyperlipidemia)   . Hypothyroid     Assessment: 73 year old female who presented to ED with ACS. Pharmacy consulted to start IV Heparin therapy. CBC is within normal limits. SCr within normal limits. Plan for cardiac cath in AM.   10/27 AM update:  Heparin level low No issues per RN CBC good  Goal of Therapy:  Heparin level 0.3-0.7 units/ml Monitor platelets by anticoagulation protocol: Yes   Plan:  Heparin 2000 units re-bolus Inc heparin drip to 900 units/hr Re-check heparin level in 6-8 hours  Narda Bonds, PharmD, [place] Pharmacist Phone: [phone]

## 2019-03-21 NOTE — Discharge Summary (Signed)
Physician Discharge Summary  Patient ID: Brittany Stout MRN: [id number] DOB/AGE: 1945-09-10 73 y.o.  Admit date: 03/20/2019 Discharge date: 03/21/2019  Primary Discharge Diagnosis: Chest pain  Secondary Discharge Diagnosis: Chest pain   Hospital Course:   73 y.o. Hipanic female with mod MR, AI, hyperlipidemia, exertional chest pain, dyspnea, abnormal stress test.  Normal HS tropnin. Normal coronary angiogram. Stress test was false positive. Okay to stop Aspirin.   Discharge Exam: Blood pressure 96/75, pulse 77, temperature 98.6 F (37 C), temperature source Oral, resp. rate 16, height 5' (1.524 m), weight 59.9 kg, SpO2 93 %.   Physical Exam  Constitutional: She is oriented to person, place, and time. She appears well-developed and well-nourished. No distress.  HENT:  Head: Normocephalic and atraumatic.  Eyes: Pupils are equal, round, and reactive to light. Conjunctivae are normal.  Neck: No JVD present.  Cardiovascular: Normal rate, regular rhythm and intact distal pulses.  Pulmonary/Chest: Effort normal and breath sounds normal. She has no wheezes. She has no rales.  Abdominal: Soft. Bowel sounds are normal. There is no rebound.  Musculoskeletal:        General: No edema.  Lymphadenopathy:    She has no cervical adenopathy.  Neurological: She is alert and oriented to person, place, and time. No cranial nerve deficit.  Skin: Skin is warm and dry.  Psychiatric: She has a normal mood and affect.  Nursing note and vitals reviewed.   Significant Diagnostic Studies: Results for Brittany Stout (MRN [id number]) as of 03/21/2019 16:58  Ref. Range 03/20/2019 17:05 03/20/2019 21:45  Troponin I (High Sensitivity) Latest Ref Range: <18 ng/L 3 3     Coronary angiography 03/21/2019: Normal coronary arteries with right dominance. No significant coronary artery disease Normal LVEDP.   Labs:   Lab Results  Component Value Date   WBC 7.3 03/21/2019   HGB 12.5  03/21/2019   HCT 38.0 03/21/2019   MCV 88.8 03/21/2019   PLT 262 03/21/2019    Recent Labs  Lab 03/20/19 1705  NA 138  K 4.1  CL 105  CO2 22  BUN 10  CREATININE 0.65  CALCIUM 9.2  GLUCOSE 106*    Lipid Panel     Component Value Date/Time   CHOL 154 10/11/2018 0915   TRIG 216 (H) 10/11/2018 0915   HDL 25 (L) 10/11/2018 0915   CHOLHDL 6.2 (H) 10/11/2018 0915   CHOLHDL 10.5 (H) 04/29/2016 1621   VLDL NOT CALC 04/29/2016 1621   LDLCALC 86 10/11/2018 0915     HEMOGLOBIN A1C Lab Results  Component Value Date   HGBA1C 6.2 02/01/2019      FOLLOW UP PLANS AND APPOINTMENTS Discharge Instructions    Diet - low sodium heart healthy   Complete by: As directed    Increase activity slowly   Complete by: As directed      Allergies as of 03/21/2019      Reactions   Almond (diagnostic) Itching, Swelling   In the eyes      Medication List    STOP taking these medications   aspirin EC 81 MG tablet   furosemide 20 MG tablet Commonly known as: LASIX     TAKE these medications   Ibuprofen 200 MG Caps Take 200 mg by mouth every 6 (six) hours as needed (pain).   isosorbide mononitrate 30 MG 24 hr tablet Commonly known as: IMDUR Take 1 tablet (30 mg total) by mouth daily. What changed: when to take this   metFORMIN 1000 MG tablet Commonly  known as: GLUCOPHAGE Take 1 tablet (1,000 mg total) by mouth 2 (two) times daily with a meal.   metoprolol succinate 25 MG 24 hr tablet Commonly known as: Toprol XL Take 1 tablet (25 mg total) by mouth daily.   nitroGLYCERIN 0.4 MG SL tablet Commonly known as: NITROSTAT Place 1 tablet (0.4 mg total) under the tongue every 5 (five) minutes as needed for chest pain.   simvastatin 10 MG tablet Commonly known as: ZOCOR Take 10 mg by mouth daily.         Vernell Leep MD, [facility] Pager: [phone] Office: [phone] If no answer: [phone]

## 2019-03-21 NOTE — Progress Notes (Signed)
[place] for Heparin Indication: chest pain/ACS  Allergies  Allergen Reactions  . Almond (Diagnostic) Itching and Swelling    In the eyes    Patient Measurements: Height: 5' (152.4 cm) Weight: 132 lb 0.9 oz (59.9 kg) IBW/kg (Calculated) : 45.5  Vital Signs: Temp: 98.4 F (36.9 C) (10/27 0904) Temp Source: Oral (10/27 0904) BP: 102/65 (10/27 0904) Pulse Rate: 64 (10/27 0904)  Labs: Recent Labs    03/20/19 1705 03/20/19 2145 03/21/19 0415 03/21/19 1207  HGB 13.3  --  12.5  --   HCT 41.0  --  38.0  --   PLT 276  --  262  --   HEPARINUNFRC  --   --  <0.10* 0.10*  CREATININE 0.65  --   --   --   TROPONINIHS 3 3  --   --     Estimated Creatinine Clearance: 51.5 mL/min (by C-G formula based on SCr of 0.65 mg/dL).   Medical History: Past Medical History:  Diagnosis Date  . Diabetes mellitus ([place])   . HLD (hyperlipidemia)   . Hypothyroid     Assessment: 73 year old female who presented to ED with ACS. Pharmacy consulted to dose IV Heparin therapy. Plan for cardiac cath today -heparin level= 0.1   Goal of Therapy:  Heparin level 0.3-0.7 units/ml Monitor platelets by anticoagulation protocol: Yes   Plan:  -No heparin adjustment as he is going to cath soon -Will follow plans  Hildred Laser, PharmD Clinical Pharmacist **Pharmacist phone directory can now be found on [email or web address] ([email or web address]).  Listed under [place].

## 2019-03-21 NOTE — Interval H&P Note (Signed)
History and Physical Interval Note:  03/21/2019 2:58 PM  Brittany Stout  has presented today for surgery, with the diagnosis of chest pain.  The various methods of treatment have been discussed with the patient and family. After consideration of risks, benefits and other options for treatment, the patient has consented to  Procedure(s): LEFT HEART CATH AND CORONARY ANGIOGRAPHY (N/A) as a surgical intervention.  The patient's history has been reviewed, patient examined, no change in status, stable for surgery.  I have reviewed the patient's chart and labs.  Questions were answered to the patient's satisfaction.    2016/2017 Appropriate Use Criteria for Coronary Revascularization Clinical Presentation: Diabetes Mellitus? Symptom Status? S/P CABG? Antianginal Therapy (# of long-acting drugs)? Results of Non-invasive testing? FFR/iFR results in all diseased vessels? Patient undergoing renal transplant? Patient undergoing percutaneous valve procedure (TAVR, MitraClip, Others)? Symptom Status:  Ischemic Symptoms  Non-invasive Testing:  Intermediate Risk  If no or indeterminate stress test, FFR/iFR results in all diseased vessels:  N/A  Diabetes Mellitus:  Yes  S/P CABG:  No  Antianginal therapy (number of long-acting drugs):  >=2  Patient undergoing renal transplant:  No  Patient undergoing percutaneous valve procedure:  No    newline 1 Vessel Disease PCI CABG  No proximal LAD involvement, No proximal left dominant LCX involvement A (8); Indication 2 M (6); Indication 2   Proximal left dominant LCX involvement A (8); Indication 5 A (8); Indication 5   Proximal LAD involvement A (8); Indication 5 A (8); Indication 5   newline 2 Vessel Disease  No proximal LAD involvement A (8); Indication 8 A (7); Indication 8   Proximal LAD involvement A (8); Indication 14 A (9); Indication 14   newline 3 Vessel Disease  Low disease complexity (e.g., focal stenoses, SYNTAX <=22) A (7); Indication 19 A  (9); Indication 19   Intermediate or high disease complexity (e.g., SYNTAX >=23) M (6); Indication 23 A (9); Indication 23   newline Left Main Disease  Isolated LMCA disease: ostial or midshaft A (7); Indication 24 A (9); Indication 24   Isolated LMCA disease: bifurcation involvement M (6); Indication 25 A (9); Indication 25   LMCA ostial or midshaft, concurrent low disease burden multivessel disease (e.g., 1-2 additional focal stenoses, SYNTAX <=22) A (7); Indication 26 A (9); Indication 26   LMCA ostial or midshaft, concurrent intermediate or high disease burden multivessel disease (e.g., 1-2 additional bifurcation stenoses, long stenoses, SYNTAX >=23) M (4); Indication 27 A (9); Indication 27   LMCA bifurcation involvement, concurrent low disease burden multivessel disease (e.g., 1-2 additional focal stenoses, SYNTAX <=22) M (6); Indication 28 A (9); Indication 28   LMCA bifurcation involvement, concurrent intermediate or high disease burden multivessel disease (e.g., 1-2 additional bifurcation stenoses, long stenoses, SYNTAX >=23)        Jrue Jarriel J Naheem [place]

## 2019-03-22 ENCOUNTER — Encounter (HOSPITAL_COMMUNITY): Payer: Self-pay | Admitting: Cardiology

## 2019-03-23 ENCOUNTER — Ambulatory Visit: Payer: Medicare Other | Admitting: Cardiology

## 2019-03-30 ENCOUNTER — Encounter: Payer: Self-pay | Admitting: Cardiology

## 2019-03-30 ENCOUNTER — Other Ambulatory Visit: Payer: Self-pay

## 2019-03-30 ENCOUNTER — Ambulatory Visit (INDEPENDENT_AMBULATORY_CARE_PROVIDER_SITE_OTHER): Payer: Medicare Other | Admitting: Cardiology

## 2019-03-30 VITALS — BP 123/79 | HR 75 | Temp 98.2°F | Ht 59.0 in | Wt 136.0 lb

## 2019-03-30 DIAGNOSIS — R0609 Other forms of dyspnea: Secondary | ICD-10-CM | POA: Insufficient documentation

## 2019-03-30 DIAGNOSIS — R0600 Dyspnea, unspecified: Secondary | ICD-10-CM

## 2019-03-30 DIAGNOSIS — I340 Nonrheumatic mitral (valve) insufficiency: Secondary | ICD-10-CM | POA: Diagnosis not present

## 2019-03-30 DIAGNOSIS — I209 Angina pectoris, unspecified: Secondary | ICD-10-CM

## 2019-03-30 DIAGNOSIS — E782 Mixed hyperlipidemia: Secondary | ICD-10-CM | POA: Diagnosis not present

## 2019-03-30 DIAGNOSIS — I10 Essential (primary) hypertension: Secondary | ICD-10-CM | POA: Diagnosis not present

## 2019-03-30 MED ORDER — ROSUVASTATIN CALCIUM 10 MG PO TABS: 10.0000 mg | ORAL_TABLET | Freq: Every day | ORAL | 11 refills | Status: AC

## 2019-03-30 MED ORDER — LISINOPRIL 10 MG PO TABS: 10.0000 mg | ORAL_TABLET | Freq: Every day | ORAL | 11 refills | Status: AC

## 2019-03-30 NOTE — Progress Notes (Signed)
Patient referred by Brittany Stout, Brittany E, MD for systolic murmur, hyperlipidemia  Subjective:   Brittany Stout, female    DOB: [birth year], 73 y.o.   MRN: [id number]   Chief Complaint  Patient presents with  . Chest Pain  . Follow-up    7-10 days    73 y.o. Hipanic female with hypertension, type 2 DM, hyperlipidemia, mod MR, AI, exertional dyspnea.   Patient's grandson assisted with interpretation.  Given patient's worsening chest pain symptoms, she underwent coronary angiography, that did not show coronary artery disease.  She has not had any chest pain, she continues to have exertional dyspnea symptoms.   Past Medical History:  Diagnosis Date  . Diabetes mellitus (HCC)   . HLD (hyperlipidemia)   . Hypothyroid      Past Surgical History:  Procedure Laterality Date  . CYST EXCISION     Face  . LEFT HEART CATH AND CORONARY ANGIOGRAPHY N/A 03/21/2019   Procedure: LEFT HEART CATH AND CORONARY ANGIOGRAPHY;  Surgeon: Brittany Stout, Brittany Wetherby J, MD;  Location: [facility];  Service: Cardiovascular;  Laterality: N/A;     Social History   Socioeconomic History  . Marital status: Legally Separated    Spouse name: Not on file  . Number of children: 6  . Years of education: Not on file  . Highest education level: Not on file  Occupational History  . Not on file  Social Needs  . Financial resource strain: Not on file  . Food insecurity    Worry: Not on file    Inability: Not on file  . Transportation needs    Medical: Not on file    Non-medical: Not on file  Tobacco Use  . Smoking status: Never Smoker  . Smokeless tobacco: Never Used  Substance and Sexual Activity  . Alcohol use: No  . Drug use: Never  . Sexual activity: Not on file  Lifestyle  . Physical activity    Days per week: Not on file    Minutes per session: Not on file  . Stress: Not on file  Relationships  . Social Musicianconnections    Talks on phone: Not on file    Gets together: Not on file    Attends  religious service: Not on file    Active member of club or organization: Not on file    Attends meetings of clubs or organizations: Not on file    Relationship status: Not on file  . Intimate partner violence    Fear of current or ex partner: Not on file    Emotionally abused: Not on file    Physically abused: Not on file    Forced sexual activity: Not on file  Other Topics Concern  . Not on file  Social History Narrative   Lives with daughter, son-in-law and grandsons. Retired from working at a school as a Art therapistcleaning part-time.      Current Outpatient Medications on File Prior to Visit  Medication Sig Dispense Refill  . Ibuprofen 200 MG CAPS Take 200 mg by mouth every 6 (six) hours as needed (pain).     . isosorbide mononitrate (IMDUR) 30 MG 24 hr tablet Take 1 tablet (30 mg total) by mouth daily. (Patient taking differently: Take 30 mg by mouth daily at 12 noon. ) 30 tablet 3  . metFORMIN (GLUCOPHAGE) 1000 MG tablet Take 1 tablet (1,000 mg total) by mouth 2 (two) times daily with a meal. 180 tablet 3  . metoprolol succinate (TOPROL XL)  25 MG 24 hr tablet Take 1 tablet (25 mg total) by mouth daily. 30 tablet 3  . nitroGLYCERIN (NITROSTAT) 0.4 MG SL tablet Place 1 tablet (0.4 mg total) under the tongue every 5 (five) minutes as needed for chest pain. 30 tablet 3  . simvastatin (ZOCOR) 10 MG tablet Take 10 mg by mouth daily.     No current facility-administered medications on file prior to visit.     Cardiovascular studies:  Coronary angiogram 03/21/2019: Normal coronary arteries with right dominance. No significant coronary artery disease Normal LVEDP.  Lexiscan Myoview stress test 02/27/2019: Lexiscan stress test was performed. Stress EKG is non-diagnostic, as this is pharmacological stress test. SPECT images reveal medium sized, moderate intensity, predominantly reversible perfusion defect in basal to apical, inferior/ inferolateral and basal inferoseptal myocardium, suggestive of  small inferior infarct with large periinfarct ischemia in LCx/PDA territory. Stress LV EF is mildly dysfunctional 49%., with mild inferolateral hypokinesis. High risk study.   Echocardiogram 10/19/2018: Normal LV systolic function with EF 60%. Left ventricle cavity is normal in size. Mild concentric hypertrophy of the left ventricle. Normal global wall motion. Doppler evidence of grade I (impaired) diastolic dysfunction, indeterminate LAP. Calculated EF 60%. Trileaflet aortic valve with mild aortic valve leaflet calcification. Mild aortic valve stenosis. Moderate (Grade II) aortic regurgitation. Aortic valve mean gradient of 12 mmHg, Vmax of 2.4 m/s. Calculated aortic valve area by continuity equation is 1.02 cm. Aortic valve area likely underestimated on Doppler. Moderate (Grade III) mitral regurgitation. Mild tricuspid regurgitation. Estimated pulmonary artery systolic pressure 24 mmHg.  EKG 01/26/2019: Sinus rhythm 74 bpm. Left anterior fascicular block. Nonspecific anterior T wave inversion.  EKG 08/04/2018: Sinus rhythm 67 bpm Left anterior fascicular block Nonspecific ST-T changes  Recent labs: Results for Brittany, Stout (MRN [id number]) as of 03/30/2019 09:17  Ref. Range 03/20/2019 17:05 03/20/2019 21:45  BASIC METABOLIC PANEL Unknown Rpt (A)   Sodium Latest Ref Range: 135 - 145 mmol/L 138   Potassium Latest Ref Range: 3.5 - 5.1 mmol/L 4.1   Chloride Latest Ref Range: 98 - 111 mmol/L 105   CO2 Latest Ref Range: 22 - 32 mmol/L 22   Glucose Latest Ref Range: 70 - 99 mg/dL 601 (H)   BUN Latest Ref Range: 8 - 23 mg/dL 10   Creatinine Latest Ref Range: 0.44 - 1.00 mg/dL 0.93   Calcium Latest Ref Range: 8.9 - 10.3 mg/dL 9.2   Anion gap Latest Ref Range: 5 - 15  11   GFR, Est Non African American Latest Ref Range: >60 mL/min >60   GFR, Est African American Latest Ref Range: >60 mL/min >60   Troponin I (High Sensitivity) Latest Ref Range: <18 ng/L 3 3   Results for Brittany, Stout (MRN [id number]) as of 03/30/2019 09:17  Ref. Range 03/21/2019 04:15  WBC Latest Ref Range: 4.0 - 10.5 K/uL 7.3  RBC Latest Ref Range: 3.87 - 5.11 MIL/uL 4.28  Hemoglobin Latest Ref Range: 12.0 - 15.0 g/dL 25.4  HCT Latest Ref Range: 36.0 - 46.0 % 38.0  MCV Latest Ref Range: 80.0 - 100.0 fL 88.8  MCH Latest Ref Range: 26.0 - 34.0 pg 29.2  MCHC Latest Ref Range: 30.0 - 36.0 g/dL 27.0  RDW Latest Ref Range: 11.5 - 15.5 % 13.4  Platelets Latest Ref Range: 150 - 400 K/uL 262  nRBC Latest Ref Range: 0.0 - 0.2 % 0.0    Review of Systems  Constitution: Negative for decreased appetite, malaise/fatigue, weight gain and weight loss.  HENT: Negative for congestion.   Eyes: Negative for visual disturbance.  Cardiovascular: Positive for chest pain. Negative for dyspnea on exertion, leg swelling, palpitations and syncope.  Respiratory: Negative for shortness of breath.   Endocrine: Negative for cold intolerance.  Hematologic/Lymphatic: Does not bruise/bleed easily.  Skin: Negative for itching and rash.  Musculoskeletal: Negative for myalgias.  Gastrointestinal: Negative for abdominal pain, nausea and vomiting.  Genitourinary: Negative for dysuria.  Neurological: Negative for dizziness and weakness.  Psychiatric/Behavioral: The patient is not nervous/anxious.   All other systems reviewed and are negative.       Vitals:   03/30/19 1308  BP: 123/79  Pulse: 75  Temp: 98.2 F (36.8 C)  SpO2: 95%     Objective:   Physical Exam  Constitutional: She is oriented to person, place, and time. She appears well-developed and well-nourished. No distress.  HENT:  Head: Normocephalic and atraumatic.  Eyes: Pupils are equal, round, and reactive to light. Conjunctivae are normal.  Neck: No JVD present.  Cardiovascular: Normal rate, regular rhythm and intact distal pulses.  Murmur heard.  Blowing decrescendo early diastolic murmur is present with a grade of 2/6 at the upper right sternal  border radiating to the apex. Pulmonary/Chest: Effort normal and breath sounds normal. She has no wheezes. She has no rales.  Abdominal: Soft. Bowel sounds are normal. There is no rebound.  Musculoskeletal:        General: No edema.  Lymphadenopathy:    She has no cervical adenopathy.  Neurological: She is alert and oriented to person, place, and time. No cranial nerve deficit.  Skin: Skin is warm and dry.  Psychiatric: She has a normal mood and affect.  Nursing note and vitals reviewed.         Assessment & Recommendations:   73 y.o. Hipanic female with hypertension, type 2 DM, hyperlipidemia, mod MR, AI, exertional dyspnea.   Exertional chest pain, dyspnea: Normal coronaries on angiogram,. Normal LVEDP. Given her exertional dyspnea symptoms, will repeat echocardiogram to evaluate her mitral and aortic regurgitation.  Concern regarding low-salt diet.  Hypertension:  In absence of coronary artery disease, switch indoor to lisinopril 10 mg daily.  Check BMP in 1 week.  Hyperlipidemia: Switch simvastatin to Crestor 10 mg daily.  Follow-up after echocardiogram.  Nigel Mormon, MD [facility] Pager: [phone] Office: [phone] If no answer Cell [phone]

## 2019-04-03 ENCOUNTER — Ambulatory Visit: Payer: Medicare Other | Admitting: Cardiology

## 2019-04-05 ENCOUNTER — Ambulatory Visit (INDEPENDENT_AMBULATORY_CARE_PROVIDER_SITE_OTHER): Payer: Medicare Other

## 2019-04-05 ENCOUNTER — Other Ambulatory Visit: Payer: Self-pay

## 2019-04-05 DIAGNOSIS — R0600 Dyspnea, unspecified: Secondary | ICD-10-CM

## 2019-04-05 DIAGNOSIS — I340 Nonrheumatic mitral (valve) insufficiency: Secondary | ICD-10-CM

## 2019-04-05 DIAGNOSIS — R0609 Other forms of dyspnea: Secondary | ICD-10-CM

## 2019-04-06 LAB — BASIC METABOLIC PANEL
BUN/Creatinine Ratio: 20 (ref 12–28)
BUN: 14 mg/dL (ref 8–27)
CO2: 24 mmol/L (ref 20–29)
Calcium: 9.6 mg/dL (ref 8.7–10.3)
Chloride: 104 mmol/L (ref 96–106)
Creatinine, Ser: 0.7 mg/dL (ref 0.57–1.00)
GFR calc Af Amer: 100 mL/min/{1.73_m2} (ref >59)
GFR calc non Af Amer: 87 mL/min/{1.73_m2} (ref >59)
Glucose: 70 mg/dL (ref 65–99)
Potassium: 4.3 mmol/L (ref 3.5–5.2)
Sodium: 142 mmol/L (ref 134–144)

## 2019-04-10 NOTE — Progress Notes (Signed)
Called pt to inform her that her lab are normal. Pt understood

## 2019-04-28 ENCOUNTER — Ambulatory Visit: Payer: Medicare Other | Admitting: Cardiology

## 2019-05-01 ENCOUNTER — Ambulatory Visit: Payer: Medicare Other | Admitting: Cardiology

## 2019-05-11 ENCOUNTER — Ambulatory Visit (INDEPENDENT_AMBULATORY_CARE_PROVIDER_SITE_OTHER): Payer: Medicare Other | Admitting: Cardiology

## 2019-05-11 ENCOUNTER — Other Ambulatory Visit: Payer: Self-pay

## 2019-05-11 ENCOUNTER — Encounter: Payer: Self-pay | Admitting: Cardiology

## 2019-05-11 VITALS — BP 137/81 | HR 88 | Ht 59.0 in | Wt 135.5 lb

## 2019-05-11 DIAGNOSIS — I351 Nonrheumatic aortic (valve) insufficiency: Secondary | ICD-10-CM

## 2019-05-11 DIAGNOSIS — I10 Essential (primary) hypertension: Secondary | ICD-10-CM | POA: Diagnosis not present

## 2019-05-11 DIAGNOSIS — I209 Angina pectoris, unspecified: Secondary | ICD-10-CM

## 2019-05-11 DIAGNOSIS — E119 Type 2 diabetes mellitus without complications: Secondary | ICD-10-CM | POA: Diagnosis not present

## 2019-05-11 NOTE — Progress Notes (Signed)
Patient referred by [name], MD for systolic murmur, hyperlipidemia  Subjective:   Brittany Stout, female    DOB: 1946-05-15, 73 y.o.   MRN: [id number]   Chief Complaint  Patient presents with  . Shortness of Breath    pt unsure of meds will call us later or tomorrow with list   . Follow-up   73 y.o. Hipanic female with hypertension, type 2 DM, hyperlipidemia, mod MR, AI, exertional dyspnea.   Patient's daughter-in-law assisted with interpretation.  She has improvement in her symptoms of chest pain and shortness of breath.   Past Medical History:  Diagnosis Date  . Diabetes mellitus ([place])   . HLD (hyperlipidemia)   . Hypothyroid      Past Surgical History:  Procedure Laterality Date  . CYST EXCISION     Face  . LEFT HEART CATH AND CORONARY ANGIOGRAPHY N/A 03/21/2019   Procedure: LEFT HEART CATH AND CORONARY ANGIOGRAPHY;  Surgeon: Nigel Mormon, MD;  Location: [facility];  Service: Cardiovascular;  Laterality: N/A;     Social History   Socioeconomic History  . Marital status: Legally Separated    Spouse name: Not on file  . Number of children: 6  . Years of education: Not on file  . Highest education level: Not on file  Occupational History  . Not on file  Tobacco Use  . Smoking status: Never Smoker  . Smokeless tobacco: Never Used  Substance and Sexual Activity  . Alcohol use: No  . Drug use: Never  . Sexual activity: Not on file  Other Topics Concern  . Not on file  Social History Narrative   Lives with daughter, son-in-law and grandsons. Retired from working at a school as a Medical illustrator.    Social Determinants of Health   Financial Resource Strain:   . Difficulty of Paying Living Expenses: Not on file  Food Insecurity:   . Worried About Charity fundraiser in the Last Year: Not on file  . Ran Out of Food in the Last Year: Not on file  Transportation Needs:   . Lack of Transportation (Medical): Not on file  . Lack  of Transportation (Non-Medical): Not on file  Physical Activity:   . Days of Exercise per Week: Not on file  . Minutes of Exercise per Session: Not on file  Stress:   . Feeling of Stress : Not on file  Social Connections:   . Frequency of Communication with Friends and Family: Not on file  . Frequency of Social Gatherings with Friends and Family: Not on file  . Attends Religious Services: Not on file  . Active Member of Clubs or Organizations: Not on file  . Attends Archivist Meetings: Not on file  . Marital Status: Not on file  Intimate Partner Violence:   . Fear of Current or Ex-Partner: Not on file  . Emotionally Abused: Not on file  . Physically Abused: Not on file  . Sexually Abused: Not on file     Current Outpatient Medications on File Prior to Visit  Medication Sig Dispense Refill  . Ibuprofen 200 MG CAPS Take 200 mg by mouth every 6 (six) hours as needed (pain).     Marland Kitchen lisinopril (ZESTRIL) 10 MG tablet Take 1 tablet (10 mg total) by mouth daily. 30 tablet 11  . metFORMIN (GLUCOPHAGE) 1000 MG tablet Take 1 tablet (1,000 mg total) by mouth 2 (two) times daily with a meal. 180 tablet 3  .  metoprolol succinate (TOPROL XL) 25 MG 24 hr tablet Take 1 tablet (25 mg total) by mouth daily. 30 tablet 3  . nitroGLYCERIN (NITROSTAT) 0.4 MG SL tablet Place 1 tablet (0.4 mg total) under the tongue every 5 (five) minutes as needed for chest pain. 30 tablet 3  . rosuvastatin (CRESTOR) 10 MG tablet Take 1 tablet (10 mg total) by mouth at bedtime. 30 tablet 11   No current facility-administered medications on file prior to visit.    Cardiovascular studies:  Echocardiogram 04/07/2019: Left ventricle cavity is normal in size. Mild concentric hypertrophy of the left ventricle. Normal LV systolic function with EF 55%. Normal global wall motion. Doppler evidence of grade I (impaired) diastolic dysfunction, normal LAP.  Trileaflet aortic valve with mild calcification of the aortic  valve annulus. Mild aortic stenosis. Aortic valve mean gradient of 12 mmHg, Vmax of 2.4 m/s. Calculated aortic valve area by continuity equation is 1.3 cm. Moderate (Grade II) aortic regurgitation. Moderate (Grade II) mitral regurgitation. Mild tricuspid regurgitation. Estimated pulmonary artery systolic pressure is 22 mmHg. No significant change compared to previous study on 10/19/2018.  Coronary angiogram 03/21/2019: Normal coronary arteries with right dominance. No significant coronary artery disease Normal LVEDP.  Lexiscan Myoview stress test 02/27/2019: Lexiscan stress test was performed. Stress EKG is non-diagnostic, as this is pharmacological stress test. SPECT images reveal medium sized, moderate intensity, predominantly reversible perfusion defect in basal to apical, inferior/ inferolateral and basal inferoseptal myocardium, suggestive of small inferior infarct with large periinfarct ischemia in LCx/PDA territory. Stress LV EF is mildly dysfunctional 49%., with mild inferolateral hypokinesis. High risk study.   Recent labs: Sep-Nov 2020: Glucose 70, BUN/Cr 14/0.7. EGFR 87. Na/K 142/4.3.  H/H 12/38. MCV 88. Platelets 262 HbA1C 6.2%  Chol 154, TG 216, HDL 25, LDL 86 (09/2018) TSH normal (2017)   Review of Systems  Constitution: Negative for decreased appetite, malaise/fatigue, weight gain and weight loss.  HENT: Negative for congestion.   Eyes: Negative for visual disturbance.  Cardiovascular: Positive for chest pain. Negative for dyspnea on exertion, leg swelling, palpitations and syncope.  Respiratory: Negative for shortness of breath.   Endocrine: Negative for cold intolerance.  Hematologic/Lymphatic: Does not bruise/bleed easily.  Skin: Negative for itching and rash.  Musculoskeletal: Negative for myalgias.  Gastrointestinal: Negative for abdominal pain, nausea and vomiting.  Genitourinary: Negative for dysuria.  Neurological: Negative for dizziness and weakness.   Psychiatric/Behavioral: The patient is not nervous/anxious.   All other systems reviewed and are negative.       Vitals:   05/11/19 1542  BP: 137/81  Pulse: 88  SpO2: 95%     Objective:   Physical Exam  Constitutional: She is oriented to person, place, and time. She appears well-developed and well-nourished. No distress.  HENT:  Head: Normocephalic and atraumatic.  Eyes: Pupils are equal, round, and reactive to light. Conjunctivae are normal.  Neck: No JVD present.  Cardiovascular: Normal rate, regular rhythm and intact distal pulses.  Murmur heard.  Blowing decrescendo early diastolic murmur is present with a grade of 2/6 at the upper right sternal border radiating to the apex. Pulmonary/Chest: Effort normal and breath sounds normal. She has no wheezes. She has no rales.  Abdominal: Soft. Bowel sounds are normal. There is no rebound.  Musculoskeletal:        General: No edema.  Lymphadenopathy:    She has no cervical adenopathy.  Neurological: She is alert and oriented to person, place, and time. No cranial nerve deficit.  Skin:  Skin is warm and dry.  Psychiatric: She has a normal mood and affect.  Nursing note and vitals reviewed.         Assessment & Recommendations:   73 y.o. Hipanic female with hypertension, type 2 DM, hyperlipidemia, mod MR, AI, exertional dyspnea.   Exertional chest pain, dyspnea: Normal coronaries on angiogram,. Normal LVEDP. Stable mild to moderate valvular regurgitations on echocardiogram. Symptoms have improved.   Hypertension:  Controlled.   Hyperlipidemia: Continue Crestor 10 mg daily.  Follow-up after echocardiogram in 1 year.   Nigel Mormon, MD [facility] Pager: [phone] Office: [phone] If no answer Cell [phone]

## 2019-05-24 ENCOUNTER — Other Ambulatory Visit: Payer: Self-pay | Admitting: *Deleted

## 2019-05-24 NOTE — Telephone Encounter (Signed)
Please call and ask patient to make an appointment to make an appointment.  At her last annual appointment, we did not cover her diabetes as she was having chest pain and was sent to the emergency room.  Her A1c is have been within normal limits and I would like to check them again.  There is a possibility that she could come off of this medication.  However, I will still approve refill.

## 2019-05-29 NOTE — Telephone Encounter (Signed)
[employer] interpreter Chrissie Noa [phone] used for this call.  Patient agreed to an appointment next Friday 06/09/2019 with pcp.  Keaisha Sublette,CMA

## 2019-05-30 MED ORDER — METFORMIN HCL 1000 MG PO TABS: 1000.0000 mg | ORAL_TABLET | Freq: Two times a day (BID) | ORAL | 3 refills | Status: DC

## 2019-06-04 ENCOUNTER — Other Ambulatory Visit: Payer: Self-pay | Admitting: Cardiology

## 2019-06-04 DIAGNOSIS — I209 Angina pectoris, unspecified: Secondary | ICD-10-CM

## 2019-06-09 ENCOUNTER — Other Ambulatory Visit: Payer: Self-pay

## 2019-06-09 ENCOUNTER — Encounter: Payer: Self-pay | Admitting: Family Medicine

## 2019-06-09 ENCOUNTER — Ambulatory Visit (INDEPENDENT_AMBULATORY_CARE_PROVIDER_SITE_OTHER): Payer: Medicare Other | Admitting: Family Medicine

## 2019-06-09 VITALS — BP 112/64 | HR 72 | Wt 135.0 lb

## 2019-06-09 DIAGNOSIS — Z1231 Encounter for screening mammogram for malignant neoplasm of breast: Secondary | ICD-10-CM

## 2019-06-09 DIAGNOSIS — Z1211 Encounter for screening for malignant neoplasm of colon: Secondary | ICD-10-CM

## 2019-06-09 DIAGNOSIS — I209 Angina pectoris, unspecified: Secondary | ICD-10-CM | POA: Diagnosis not present

## 2019-06-09 DIAGNOSIS — E119 Type 2 diabetes mellitus without complications: Secondary | ICD-10-CM

## 2019-06-09 DIAGNOSIS — Z23 Encounter for immunization: Secondary | ICD-10-CM | POA: Diagnosis not present

## 2019-06-09 DIAGNOSIS — E782 Mixed hyperlipidemia: Secondary | ICD-10-CM | POA: Diagnosis not present

## 2019-06-09 LAB — POCT GLYCOSYLATED HEMOGLOBIN (HGB A1C): HbA1c, POC (controlled diabetic range): 5.8 % (ref 0.0–7.0)

## 2019-06-09 NOTE — Assessment & Plan Note (Addendum)
Subject    Given patient's age and A1c, will discontinue Metformin at this time.  Will recheck A1c in 3 months with lab visit to ensure she remains controlled.  Would also like to change her rosuvastatin 10 mg to a high intensity statin.

## 2019-06-09 NOTE — Patient Instructions (Addendum)
Dear Brittany Stout,   It was good to see you! Thank you for taking your time to come in to be seen. Today, we discussed the following:   Diabetes   Stop taking metformin. Please follow up in 3 months for a lab appointment to make sure your A1C is still normal without taking the metformin.   I am going to call your cardiologist to make sure it is okay with him to change your high cholesterol medication. I will call you if we do make the change.   To get the coronavirus shot: Please call [phone] to schedule an appointment.    Deje de tomar metformina. Haga un seguimiento en 3 meses para una cita de laboratorio para asegurarse de que su A1C an sea normal sin tomar metformina.  Voy a llamar a su cardilogo para asegurarme de que puede cambiar su medicamento para el colesterol alto. Te llamar si hacemos el cambio.  Para recibir la vacuna contra el coronavirus: llame al [phone] para programar una cita, o correo [email or web address] [email or web address]   Tambin envi la vacuna contra el ttanos a la [place]. Debe recibir esta vacuna. Vaya all para colocarlo.  Tambin llame para programar una cita para su mamografa.  Be well,   Genia Hotter, M.D   [facility] [phone]  *Sign up for MyChart for instant access to your health profile, labs, orders, upcoming appointments or to contact your provider with questions*  ===================================================================================

## 2019-06-09 NOTE — Progress Notes (Deleted)
DM 2 -- Stop metformin -- recheck A1C in 6 months   HLD -- check lipid profile, change medication -- call Dr. Rosemary Holms    The 10-year ASCVD risk score Denman George DC Montez Hageman., et al., 2013) is: 22.6%   Values used to calculate the score:     Age: 74 years     Sex: Female     Is Non-Hispanic African American: No     Diabetic: Yes     Tobacco smoker: No     Systolic Blood Pressure: 112 mmHg     Is BP treated: Yes     HDL Cholesterol: 25 mg/dL     Total Cholesterol: 154 mg/dL  HTN:   Lisinopril  Metoprolol

## 2019-06-09 NOTE — Progress Notes (Signed)
  Subjective  CC: Diabetes  RUE:[id number] Brittany Stout is a 74 y.o. female who presents today with the following problems:  Diabetes follow-up Patient last visit was cut short due to chest pain.  Today, she returns to review her diabetes.  She is currently taking Metformin 1000 mg twice daily. Some days, she takes it once daily.   HLD -- check lipid profile, change medication -- call Dr. Rosemary Holms    HTN: lisinopril and metoprolol and managed by cardiology.   Objective  Physical Exam:  BP 112/64   Pulse 72   Wt 135 lb (61.2 kg)   SpO2 97%   BMI 27.27 kg/m  General: Well-appearing female, no acute distress  Pertinent Labs:  Hemoglobin A1c 5.8 Assessment & Plan    Problem List Items Addressed This Visit      Active Problems   HLD (hyperlipidemia)    The 10-year ASCVD risk score Denman George DC Jr., et al., 2013) is: 22.6%   Values used to calculate the score:     Age: 32 years     Sex: Female     Is Non-Hispanic African American: No     Diabetic: Yes     Tobacco smoker: No     Systolic Blood Pressure: 112 mmHg     Is BP treated: Yes     HDL Cholesterol: 25 mg/dL     Total Cholesterol: 154 mg/dL  Would benefit from HIT. Her cardiologist manages this medication, will send Epic message.       Relevant Medications   isosorbide mononitrate (IMDUR) 30 MG 24 hr tablet   Type 2 diabetes mellitus without complication, without long-term current use of insulin (HCC) - Primary    Given patient's age and A1c, will discontinue Metformin at this time.  Will recheck A1c in 3 months with lab visit to ensure she remains controlled.  Would also like to change her rosuvastatin 10 mg to a high intensity statin.      Relevant Orders   HgB A1c (Completed)    Other Visit Diagnoses    Breast cancer screening by mammogram       Relevant Orders   MS DIGITAL SCREENING BILATERAL   Need for 23-polyvalent pneumococcal polysaccharide vaccine       Relevant Orders   Pneumococcal polysaccharide vaccine  23-valent greater than or equal to 2yo subcutaneous/IM (Completed)     Follow up in 3 months for A1C check - lab only is appropriate.   Health Maintenance discussed with patient and patient agrees to address when able.  Health Maintenance Due  Topic Date Due  . Hepatitis C Screening  07-Jan-1946  . TETANUS/TDAP  03/16/1966  . COLONOSCOPY  03/16/1997  . MAMMOGRAM  04/14/2019   TDAP sent to pharamcy.   Melene Plan, M.D.  7:05 AM 06/12/2019

## 2019-06-12 NOTE — Progress Notes (Deleted)
  Subjective  CC: Diabetes  WLN:[id number] Archey is a 74 y.o. female who presents today with the following problems:  *** ***   Objective  Physical Exam:  BP 112/64   Pulse 72   Wt 135 lb (61.2 kg)   SpO2 97%   BMI 27.27 kg/m  General: ***   Pertinent Labs:   Assessment & Plan    Problem List Items Addressed This Visit      Active Problems   HLD (hyperlipidemia)    The 10-year ASCVD risk score Denman George DC Jr., et al., 2013) is: 22.6%   Values used to calculate the score:     Age: 86 years     Sex: Female     Is Non-Hispanic African American: No     Diabetic: Yes     Tobacco smoker: No     Systolic Blood Pressure: 112 mmHg     Is BP treated: Yes     HDL Cholesterol: 25 mg/dL     Total Cholesterol: 154 mg/dL  Would benefit from HIT. Her cardiologist manages this medication, will send Epic message.       Relevant Medications   isosorbide mononitrate (IMDUR) 30 MG 24 hr tablet   Type 2 diabetes mellitus without complication, without long-term current use of insulin (HCC) - Primary    Given patient's age and A1c, will discontinue Metformin at this time.  Will recheck A1c in 3 months with lab visit to ensure she remains controlled.  Would also like to change her rosuvastatin 10 mg to a high intensity statin.      Relevant Orders   HgB A1c (Completed)    Other Visit Diagnoses    Breast cancer screening by mammogram       Relevant Orders   MS DIGITAL SCREENING BILATERAL   Need for 23-polyvalent pneumococcal polysaccharide vaccine       Relevant Orders   Pneumococcal polysaccharide vaccine 23-valent greater than or equal to 2yo subcutaneous/IM (Completed)   Encounter for screening colonoscopy for non-high-risk patient       Relevant Orders   Screening Colonoscopy 50-75y.o (Completed)   Ambulatory referral to Gastroenterology      Health Maintenance discussed with patient and patient agrees to address when able.  Health Maintenance Due  Topic Date Due  .  Hepatitis C Screening  [birth year]  . TETANUS/TDAP  03/16/1966  . MAMMOGRAM  04/14/2019    Follow-up: {plan; follow up:13214}  Melene Plan, M.D.  9:59 AM 06/12/2019

## 2019-06-12 NOTE — Assessment & Plan Note (Addendum)
The 10-year ASCVD risk score Denman George DC Montez Hageman., et al., 2013) is: 22.6%   Values used to calculate the score:     Age: 74 years     Sex: Female     Is Non-Hispanic African American: No     Diabetic: Yes     Tobacco smoker: No     Systolic Blood Pressure: 112 mmHg     Is BP treated: Yes     HDL Cholesterol: 25 mg/dL     Total Cholesterol: 154 mg/dL  Would benefit from HIT. Her cardiologist manages this medication, will send Epic message.

## 2019-06-19 ENCOUNTER — Other Ambulatory Visit: Payer: Self-pay | Admitting: Family Medicine

## 2019-07-07 ENCOUNTER — Other Ambulatory Visit: Payer: Self-pay | Admitting: Cardiology

## 2019-07-07 DIAGNOSIS — I209 Angina pectoris, unspecified: Secondary | ICD-10-CM

## 2020-02-01 ENCOUNTER — Emergency Department (HOSPITAL_COMMUNITY): Payer: Medicare Other

## 2020-02-01 ENCOUNTER — Encounter (HOSPITAL_COMMUNITY): Payer: Self-pay

## 2020-02-01 ENCOUNTER — Emergency Department (HOSPITAL_COMMUNITY)
Admission: EM | Admit: 2020-02-01 | Discharge: 2020-02-02 | Disposition: A | Discharge status: Home / Self Care | Payer: Medicare Other | Attending: Emergency Medicine | Admitting: Emergency Medicine

## 2020-02-01 DIAGNOSIS — E039 Hypothyroidism, unspecified: Secondary | ICD-10-CM | POA: Insufficient documentation

## 2020-02-01 DIAGNOSIS — I10 Essential (primary) hypertension: Secondary | ICD-10-CM | POA: Insufficient documentation

## 2020-02-01 DIAGNOSIS — M79602 Pain in left arm: Secondary | ICD-10-CM | POA: Diagnosis not present

## 2020-02-01 DIAGNOSIS — R0789 Other chest pain: Secondary | ICD-10-CM | POA: Diagnosis not present

## 2020-02-01 DIAGNOSIS — E119 Type 2 diabetes mellitus without complications: Secondary | ICD-10-CM | POA: Diagnosis not present

## 2020-02-01 DIAGNOSIS — Z79899 Other long term (current) drug therapy: Secondary | ICD-10-CM | POA: Insufficient documentation

## 2020-02-01 DIAGNOSIS — R079 Chest pain, unspecified: Secondary | ICD-10-CM | POA: Diagnosis present

## 2020-02-01 DIAGNOSIS — Z7984 Long term (current) use of oral hypoglycemic drugs: Secondary | ICD-10-CM | POA: Diagnosis not present

## 2020-02-01 LAB — BASIC METABOLIC PANEL
Anion gap: 12 (ref 5–15)
BUN: 15 mg/dL (ref 8–23)
CO2: 22 mmol/L (ref 22–32)
Calcium: 9.5 mg/dL (ref 8.9–10.3)
Chloride: 104 mmol/L (ref 98–111)
Creatinine, Ser: 0.61 mg/dL (ref 0.44–1.00)
GFR calc Af Amer: >60 mL/min (ref >60)
GFR calc non Af Amer: >60 mL/min (ref >60)
Glucose, Bld: 122 mg/dL — ABNORMAL HIGH (ref 70–99)
Potassium: 3.7 mmol/L (ref 3.5–5.1)
Sodium: 138 mmol/L (ref 135–145)

## 2020-02-01 LAB — CBC
HCT: 42.1 % (ref 36.0–46.0)
Hemoglobin: 13.7 g/dL (ref 12.0–15.0)
MCH: 28.8 pg (ref 26.0–34.0)
MCHC: 32.5 g/dL (ref 30.0–36.0)
MCV: 88.6 fL (ref 80.0–100.0)
Platelets: 304 10*3/uL (ref 150–400)
RBC: 4.75 MIL/uL (ref 3.87–5.11)
RDW: 12.9 % (ref 11.5–15.5)
WBC: 7.3 10*3/uL (ref 4.0–10.5)
nRBC: 0 % (ref 0.0–0.2)

## 2020-02-01 LAB — TROPONIN I (HIGH SENSITIVITY): Troponin I (High Sensitivity): 11 ng/L (ref <18)

## 2020-02-01 NOTE — ED Triage Notes (Signed)
Triage completed with translator:  Pt arrives to ED w/ c/o chest pain that started today. Pt states she is having "heart pain". Pt states her son passed away recently d/t covid. Pt not answering questions appropriately in triage. When asked if pt was in physical or emotional pain, but did not answer.

## 2020-02-02 LAB — TROPONIN I (HIGH SENSITIVITY): Troponin I (High Sensitivity): 14 ng/L (ref <18)

## 2020-02-02 MED ORDER — ACETAMINOPHEN 325 MG PO TABS
650.0000 mg | ORAL_TABLET | Freq: Once | ORAL | Status: AC
  Administered 2020-02-02: 650 mg via ORAL
  Filled 2020-02-02: qty 2

## 2020-02-02 NOTE — ED Provider Notes (Signed)
[facility] EMERGENCY DEPARTMENT Provider Note   CSN: [id number] Arrival date & time: 02/01/20  1834   History Chief Complaint  Patient presents with  . Chest Pain    Brittany Stout is a 74 y.o. female.  The history is provided by the patient. A language interpreter was used.  Chest Pain She has history of diabetes, hyperlipidemia and comes in because of left-sided chest pain throughout the day.  She has difficulty describing it but it does radiate to her left arm.  There is no associated dyspnea, nausea, diaphoresis.  Pain is not related to exertion or breathing.  Nothing makes it better, nothing makes it worse.  She has not tried anything to treat it.  She is concerned that it is her heart.  She does relate that she was very sad today.  Her son had just died from Covid.  She is a non-smoker.  Past Medical History:  Diagnosis Date  . Diabetes mellitus (HCC)   . HLD (hyperlipidemia)   . Hypothyroid     Patient Active Problem List   Diagnosis Date Noted  . Exertional dyspnea 03/30/2019  . Essential hypertension 03/30/2019  . Angina pectoris (HCC) 01/26/2019  . Leg edema, left 10/30/2018  . Moderate mitral insufficiency 10/30/2018  . Moderate aortic insufficiency 10/30/2018  . Systolic murmur 08/04/2018  . Exertional chest pain 08/04/2018  . HLD (hyperlipidemia) 02/25/2012  . Type 2 diabetes mellitus without complication, without long-term current use of insulin (HCC) 02/25/2012  . Hypothyroidism 02/25/2012    Past Surgical History:  Procedure Laterality Date  . CYST EXCISION     Face  . LEFT HEART CATH AND CORONARY ANGIOGRAPHY N/A 03/21/2019   Procedure: LEFT HEART CATH AND CORONARY ANGIOGRAPHY;  Surgeon: Elder Negus, MD;  Location: [facility];  Service: Cardiovascular;  Laterality: N/A;     OB History   No obstetric history on file.    Obstetric Comments  6 vaginal births        Family History  Problem Relation Age of Onset   . Breast cancer Neg Hx     Social History   Tobacco Use  . Smoking status: Never Smoker  . Smokeless tobacco: Never Used  Vaping Use  . Vaping Use: Never used  Substance Use Topics  . Alcohol use: No  . Drug use: Never    Home Medications Prior to Admission medications   Medication Sig Start Date End Date Taking? Authorizing Provider  Ibuprofen 200 MG CAPS Take 200 mg by mouth every 6 (six) hours as needed (pain).     [provider]  isosorbide mononitrate (IMDUR) 30 MG 24 hr tablet Take 30 mg by mouth daily. 04/29/19   [provider]  lisinopril (ZESTRIL) 10 MG tablet Take 1 tablet (10 mg total) by mouth daily. 03/30/19 03/29/20  Patwardhan, Anabel Bene, MD  metFORMIN (GLUCOPHAGE) 1000 MG tablet Take 1 tablet (1,000 mg total) by mouth 2 (two) times daily with a meal. 05/30/19   Melene Plan, MD  metoprolol succinate (TOPROL-XL) 25 MG 24 hr tablet TAKE 1 TABLET(25 MG) BY MOUTH DAILY 07/07/19   Patwardhan, Anabel Bene, MD  nitroGLYCERIN (NITROSTAT) 0.4 MG SL tablet Place 1 tablet (0.4 mg total) under the tongue every 5 (five) minutes as needed for chest pain. 08/04/18 03/30/19  Patwardhan, Anabel Bene, MD  rosuvastatin (CRESTOR) 10 MG tablet Take 1 tablet (10 mg total) by mouth at bedtime. 03/30/19 03/29/20  Elder Negus, MD  Allergies    Almond (diagnostic)  Review of Systems   Review of Systems  Cardiovascular: Positive for chest pain.  All other systems reviewed and are negative.   Physical Exam Updated Vital Signs BP 121/72 (BP Location: Right Arm)   Pulse 65   Temp 98.2 F (36.8 C) (Oral)   Resp 17   SpO2 100%   Physical Exam Vitals and nursing note reviewed.   74 year old female, resting comfortably and in no acute distress. Vital signs are normal. Oxygen saturation is 100%, which is normal. Head is normocephalic and atraumatic. PERRLA, EOMI. Oropharynx is clear. Neck is nontender and supple without adenopathy or JVD. Back is nontender and there  is no CVA tenderness. Lungs are clear without rales, wheezes, or rhonchi. Chest is mildly tender in the left parasternal area.  There is no crepitus. Heart has regular rate and rhythm without murmur. Abdomen is soft, flat, nontender without masses or hepatosplenomegaly and peristalsis is normoactive. Extremities have no cyanosis or edema, full range of motion is present. Skin is warm and dry without rash. Neurologic: Mental status is normal, cranial nerves are intact, there are no motor or sensory deficits.  ED Results / Procedures / Treatments   Labs (all labs ordered are listed, but only abnormal results are displayed) Labs Reviewed  BASIC METABOLIC PANEL - Abnormal; Notable for the following components:      Result Value   Glucose, Bld 122 (*)    All other components within normal limits  CBC  TROPONIN I (HIGH SENSITIVITY)  TROPONIN I (HIGH SENSITIVITY)    EKG None  Radiology DG Chest 2 View  Result Date: 02/01/2020 CLINICAL DATA:  74 year old female with chest pain. EXAM: CHEST - 2 VIEW COMPARISON:  Portable chest 03/20/2019. FINDINGS: PA and lateral views. Chronically low lung volumes similar to the prior study. Atelectasis and crowding of markings at both lung bases. No superimposed pneumothorax, pulmonary edema or pleural effusion. Grossly normal cardiac and mediastinal contours. Visualized tracheal air column is within normal limits. Lower thoracic and upper lumbar kyphoscoliosis. No acute osseous abnormality identified. Negative visible bowel gas pattern. IMPRESSION: 1. Very low lung volumes, similar to a portable exam last year. No acute cardiopulmonary abnormality. 2. Spinal kyphoscoliosis. Electronically Signed   By: Odessa Fleming M.D.   On: 02/01/2020 19:05    Procedures Procedures  Medications Ordered in ED Medications  acetaminophen (TYLENOL) tablet 650 mg (has no administration in time range)    ED Course  I have reviewed the triage vital signs and the nursing  notes.  Pertinent labs & imaging results that were available during my care of the patient were reviewed by me and considered in my medical decision making (see chart for details).  MDM Rules/Calculators/A&P Chest pain of uncertain cause.  ECG is normal.  Troponin is normal x2.  Review of old records shows she had a heart catheterization in October 2020 which showed normal coronary arteries.  Given recent normal heart catheterization, it is very unlikely that her pain is secondary to angina.  With period of brief, consider Takotsubo's cardiomyopathy, but no ECG changes or elevated troponin.  She is referred back to her cardiologist for further outpatient evaluation, given reassurance about negative findings today.  On a separate note, patient states that she was vaccinated against Covid while in [place], but she does not trust the [place] vaccines and wants to get vaccinated here.  Recommended that she go to one of the pharmacy vaccination clinics.  Final Clinical  Impression(s) / ED Diagnoses Final diagnoses:  Atypical chest pain    Rx / DC Orders ED Discharge Orders    None       Dione Booze, MD 02/02/20 [phone]

## 2020-02-02 NOTE — Discharge Instructions (Signed)
Puede obtener la vacuna COVID en [facility].  Puede tomar ibuprofeno o acetaminofn segn sea necesario para el dolor.

## 2020-02-09 ENCOUNTER — Ambulatory Visit: Payer: Medicare Other | Admitting: Cardiology

## 2020-02-09 NOTE — Progress Notes (Deleted)
Patient referred by [name], MD for systolic murmur, hyperlipidemia  Subjective:   Brittany Stout, female    DOB: 1946-05-07, 74 y.o.   MRN: [id number]   No chief complaint on file.  74 y.o. Hipanic female with hypertension, type 2 DM, hyperlipidemia, mod MR, AI, exertional dyspnea.   Patient's daughter-in-law assisted with interpretation.  She has improvement in her symptoms of chest pain and shortness of breath.   Past Medical History:  Diagnosis Date  . Diabetes mellitus ([place])   . HLD (hyperlipidemia)   . Hypothyroid      Past Surgical History:  Procedure Laterality Date  . CYST EXCISION     Face  . LEFT HEART CATH AND CORONARY ANGIOGRAPHY N/A 03/21/2019   Procedure: LEFT HEART CATH AND CORONARY ANGIOGRAPHY;  Surgeon: Nigel Mormon, MD;  Location: [place] [facility];  Service: Cardiovascular;  Laterality: N/A;     Social History   Socioeconomic History  . Marital status: Legally Separated    Spouse name: Not on file  . Number of children: 6  . Years of education: Not on file  . Highest education level: Not on file  Occupational History  . Not on file  Tobacco Use  . Smoking status: Never Smoker  . Smokeless tobacco: Never Used  Vaping Use  . Vaping Use: Never used  Substance and Sexual Activity  . Alcohol use: No  . Drug use: Never  . Sexual activity: Not on file  Other Topics Concern  . Not on file  Social History Narrative   Lives with daughter, son-in-law and grandsons. Retired from working at a school as a Medical illustrator.    Social Determinants of Health   Financial Resource Strain:   . Difficulty of Paying Living Expenses: Not on file  Food Insecurity:   . Worried About Charity fundraiser in the Last Year: Not on file  . Ran Out of Food in the Last Year: Not on file  Transportation Needs:   . Lack of Transportation (Medical): Not on file  . Lack of Transportation (Non-Medical): Not on file  Physical Activity:   . Days  of Exercise per Week: Not on file  . Minutes of Exercise per Session: Not on file  Stress:   . Feeling of Stress : Not on file  Social Connections:   . Frequency of Communication with Friends and Family: Not on file  . Frequency of Social Gatherings with Friends and Family: Not on file  . Attends Religious Services: Not on file  . Active Member of Clubs or Organizations: Not on file  . Attends Archivist Meetings: Not on file  . Marital Status: Not on file  Intimate Partner Violence:   . Fear of Current or Ex-Partner: Not on file  . Emotionally Abused: Not on file  . Physically Abused: Not on file  . Sexually Abused: Not on file     Current Outpatient Medications on File Prior to Visit  Medication Sig Dispense Refill  . Ibuprofen 200 MG CAPS Take 200 mg by mouth every 6 (six) hours as needed (pain).     . isosorbide mononitrate (IMDUR) 30 MG 24 hr tablet Take 30 mg by mouth daily.    Marland Kitchen lisinopril (ZESTRIL) 10 MG tablet Take 1 tablet (10 mg total) by mouth daily. 30 tablet 11  . metFORMIN (GLUCOPHAGE) 1000 MG tablet Take 1 tablet (1,000 mg total) by mouth 2 (two) times daily with a meal. 180 tablet  3  . metoprolol succinate (TOPROL-XL) 25 MG 24 hr tablet TAKE 1 TABLET(25 MG) BY MOUTH DAILY 30 tablet 3  . nitroGLYCERIN (NITROSTAT) 0.4 MG SL tablet Place 1 tablet (0.4 mg total) under the tongue every 5 (five) minutes as needed for chest pain. 30 tablet 3  . rosuvastatin (CRESTOR) 10 MG tablet Take 1 tablet (10 mg total) by mouth at bedtime. 30 tablet 11   No current facility-administered medications on file prior to visit.    Cardiovascular studies:  Echocardiogram 04/07/2019: Left ventricle cavity is normal in size. Mild concentric hypertrophy of the left ventricle. Normal LV systolic function with EF 55%. Normal global wall motion. Doppler evidence of grade I (impaired) diastolic dysfunction, normal LAP.  Trileaflet aortic valve with mild calcification of the aortic valve  annulus. Mild aortic stenosis. Aortic valve mean gradient of 12 mmHg, Vmax of 2.4 m/s. Calculated aortic valve area by continuity equation is 1.3 cm. Moderate (Grade II) aortic regurgitation. Moderate (Grade II) mitral regurgitation. Mild tricuspid regurgitation. Estimated pulmonary artery systolic pressure is 22 mmHg. No significant change compared to previous study on 10/19/2018.  Coronary angiogram 03/21/2019: Normal coronary arteries with right dominance. No significant coronary artery disease Normal LVEDP.  Lexiscan Myoview stress test 02/27/2019: Lexiscan stress test was performed. Stress EKG is non-diagnostic, as this is pharmacological stress test. SPECT images reveal medium sized, moderate intensity, predominantly reversible perfusion defect in basal to apical, inferior/ inferolateral and basal inferoseptal myocardium, suggestive of small inferior infarct with large periinfarct ischemia in LCx/PDA territory. Stress LV EF is mildly dysfunctional 49%., with mild inferolateral hypokinesis. High risk study.   Recent labs: Sep-Nov 2020: Glucose 70, BUN/Cr 14/0.7. EGFR 87. Na/K 142/4.3.  H/H 12/38. MCV 88. Platelets 262 HbA1C 6.2%  Chol 154, TG 216, HDL 25, LDL 86 (09/2018) TSH normal (2017)   Review of Systems  Constitutional: Negative for decreased appetite, malaise/fatigue, weight gain and weight loss.  HENT: Negative for congestion.   Eyes: Negative for visual disturbance.  Cardiovascular: Positive for chest pain. Negative for dyspnea on exertion, leg swelling, palpitations and syncope.  Respiratory: Negative for shortness of breath.   Endocrine: Negative for cold intolerance.  Hematologic/Lymphatic: Does not bruise/bleed easily.  Skin: Negative for itching and rash.  Musculoskeletal: Negative for myalgias.  Gastrointestinal: Negative for abdominal pain, nausea and vomiting.  Genitourinary: Negative for dysuria.  Neurological: Negative for dizziness and weakness.    Psychiatric/Behavioral: The patient is not nervous/anxious.   All other systems reviewed and are negative.      *** There were no vitals filed for this visit.   Objective:   Physical Exam Vitals and nursing note reviewed.  Constitutional:      General: She is not in acute distress.    Appearance: She is well-developed.  HENT:     Head: Normocephalic and atraumatic.  Eyes:     Conjunctiva/sclera: Conjunctivae normal.     Pupils: Pupils are equal, round, and reactive to light.  Neck:     Vascular: No JVD.  Cardiovascular:     Rate and Rhythm: Normal rate and regular rhythm.     Pulses: Intact distal pulses.     Heart sounds: Murmur heard.  Blowing decrescendo early diastolic murmur is present with a grade of 2/4 at the upper right sternal border radiating to the apex.   Pulmonary:     Effort: Pulmonary effort is normal.     Breath sounds: Normal breath sounds. No wheezing or rales.  Abdominal:  General: Bowel sounds are normal.     Palpations: Abdomen is soft.     Tenderness: There is no rebound.  Lymphadenopathy:     Cervical: No cervical adenopathy.  Skin:    General: Skin is warm and dry.  Neurological:     Mental Status: She is alert and oriented to person, place, and time.     Cranial Nerves: No cranial nerve deficit.           Assessment & Recommendations:   74 y.o. Hipanic female with hypertension, type 2 DM, hyperlipidemia, mod MR, AI, exertional dyspnea.   Exertional chest pain, dyspnea: Normal coronaries on angiogram,. Normal LVEDP. Stable mild to moderate valvular regurgitations on echocardiogram. Symptoms have improved.   Hypertension:  Controlled.   Hyperlipidemia: Continue Crestor 10 mg daily.  Follow-up after echocardiogram in 1 year.   Nigel Mormon, MD [facility] Pager: [phone] Office: [phone] If no answer Cell [phone]

## 2020-05-09 ENCOUNTER — Telehealth: Payer: Self-pay | Admitting: Cardiology

## 2020-05-09 ENCOUNTER — Other Ambulatory Visit: Payer: Medicare Other

## 2020-05-16 ENCOUNTER — Ambulatory Visit: Payer: Medicare Other | Admitting: Cardiology

## 2020-05-21 ENCOUNTER — Other Ambulatory Visit: Payer: Self-pay | Admitting: Family Medicine

## 2020-05-29 ENCOUNTER — Ambulatory Visit: Payer: Medicare Other | Admitting: Cardiology

## 2020-05-29 NOTE — Progress Notes (Deleted)
Patient referred by [name], MD for systolic murmur, hyperlipidemia  Subjective:   Brittany Stout, female    DOB: 01-17-1946, 75 y.o.   MRN: [id number]   No chief complaint on file.  75 y.o. Hipanic female with hypertension, type 2 DM, hyperlipidemia, mod MR, AI, exertional dyspnea.   Patient's daughter-in-law assisted with interpretation.  She has improvement in her symptoms of chest pain and shortness of breath.   Past Medical History:  Diagnosis Date  . Diabetes mellitus ([place])   . HLD (hyperlipidemia)   . Hypothyroid      Past Surgical History:  Procedure Laterality Date  . CYST EXCISION     Face  . LEFT HEART CATH AND CORONARY ANGIOGRAPHY N/A 03/21/2019   Procedure: LEFT HEART CATH AND CORONARY ANGIOGRAPHY;  Surgeon: Nigel Mormon, MD;  Location: [place] [facility];  Service: Cardiovascular;  Laterality: N/A;     Social History   Socioeconomic History  . Marital status: Legally Separated    Spouse name: Not on file  . Number of children: 6  . Years of education: Not on file  . Highest education level: Not on file  Occupational History  . Not on file  Tobacco Use  . Smoking status: Never Smoker  . Smokeless tobacco: Never Used  Vaping Use  . Vaping Use: Never used  Substance and Sexual Activity  . Alcohol use: No  . Drug use: Never  . Sexual activity: Not on file  Other Topics Concern  . Not on file  Social History Narrative   Lives with daughter, son-in-law and grandsons. Retired from working at a school as a Medical illustrator.    Social Determinants of Health   Financial Resource Strain: Not on file  Food Insecurity: Not on file  Transportation Needs: Not on file  Physical Activity: Not on file  Stress: Not on file  Social Connections: Not on file  Intimate Partner Violence: Not on file     Current Outpatient Medications on File Prior to Visit  Medication Sig Dispense Refill  . Ibuprofen 200 MG CAPS Take 200 mg by mouth  every 6 (six) hours as needed (pain).     . isosorbide mononitrate (IMDUR) 30 MG 24 hr tablet Take 30 mg by mouth daily.    Marland Kitchen lisinopril (ZESTRIL) 10 MG tablet Take 1 tablet (10 mg total) by mouth daily. 30 tablet 11  . metFORMIN (GLUCOPHAGE) 1000 MG tablet TAKE 1 TABLET(1000 MG) BY MOUTH TWICE DAILY WITH A MEAL 180 tablet 3  . metoprolol succinate (TOPROL-XL) 25 MG 24 hr tablet TAKE 1 TABLET(25 MG) BY MOUTH DAILY 30 tablet 3  . nitroGLYCERIN (NITROSTAT) 0.4 MG SL tablet Place 1 tablet (0.4 mg total) under the tongue every 5 (five) minutes as needed for chest pain. 30 tablet 3  . rosuvastatin (CRESTOR) 10 MG tablet Take 1 tablet (10 mg total) by mouth at bedtime. 30 tablet 11   No current facility-administered medications on file prior to visit.    Cardiovascular studies:  Echocardiogram 04/07/2019: Left ventricle cavity is normal in size. Mild concentric hypertrophy of the left ventricle. Normal LV systolic function with EF 55%. Normal global wall motion. Doppler evidence of grade I (impaired) diastolic dysfunction, normal LAP.  Trileaflet aortic valve with mild calcification of the aortic valve annulus. Mild aortic stenosis. Aortic valve mean gradient of 12 mmHg, Vmax of 2.4 m/s. Calculated aortic valve area by continuity equation is 1.3 cm. Moderate (Grade II) aortic regurgitation. Moderate (Grade  II) mitral regurgitation. Mild tricuspid regurgitation. Estimated pulmonary artery systolic pressure is 22 mmHg. No significant change compared to previous study on 10/19/2018.  Coronary angiogram 03/21/2019: Normal coronary arteries with right dominance. No significant coronary artery disease Normal LVEDP.  Lexiscan Myoview stress test 02/27/2019: Lexiscan stress test was performed. Stress EKG is non-diagnostic, as this is pharmacological stress test. SPECT images reveal medium sized, moderate intensity, predominantly reversible perfusion defect in basal to apical, inferior/ inferolateral and  basal inferoseptal myocardium, suggestive of small inferior infarct with large periinfarct ischemia in LCx/PDA territory. Stress LV EF is mildly dysfunctional 49%., with mild inferolateral hypokinesis. High risk study.   Recent labs: Sep-Nov 2020: Glucose 70, BUN/Cr 14/0.7. EGFR 87. Na/K 142/4.3.  H/H 12/38. MCV 88. Platelets 262 HbA1C 6.2%  Chol 154, TG 216, HDL 25, LDL 86 (09/2018) TSH normal (2017)   Review of Systems  Constitutional: Negative for decreased appetite, malaise/fatigue, weight gain and weight loss.  HENT: Negative for congestion.   Eyes: Negative for visual disturbance.  Cardiovascular: Positive for chest pain. Negative for dyspnea on exertion, leg swelling, palpitations and syncope.  Respiratory: Negative for shortness of breath.   Endocrine: Negative for cold intolerance.  Hematologic/Lymphatic: Does not bruise/bleed easily.  Skin: Negative for itching and rash.  Musculoskeletal: Negative for myalgias.  Gastrointestinal: Negative for abdominal pain, nausea and vomiting.  Genitourinary: Negative for dysuria.  Neurological: Negative for dizziness and weakness.  Psychiatric/Behavioral: The patient is not nervous/anxious.   All other systems reviewed and are negative.       There were no vitals filed for this visit.   Objective:   Physical Exam Vitals and nursing note reviewed.  Constitutional:      General: She is not in acute distress.    Appearance: She is well-developed.  HENT:     Head: Normocephalic and atraumatic.  Eyes:     Conjunctiva/sclera: Conjunctivae normal.     Pupils: Pupils are equal, round, and reactive to light.  Neck:     Vascular: No JVD.  Cardiovascular:     Rate and Rhythm: Normal rate and regular rhythm.     Pulses: Intact distal pulses.     Heart sounds: Murmur heard.   Blowing decrescendo early diastolic murmur is present with a grade of 2/4 at the upper right sternal border radiating to the apex.   Pulmonary:     Effort:  Pulmonary effort is normal.     Breath sounds: Normal breath sounds. No wheezing or rales.  Abdominal:     General: Bowel sounds are normal.     Palpations: Abdomen is soft.     Tenderness: There is no rebound.  Lymphadenopathy:     Cervical: No cervical adenopathy.  Skin:    General: Skin is warm and dry.  Neurological:     Mental Status: She is alert and oriented to person, place, and time.     Cranial Nerves: No cranial nerve deficit.           Assessment & Recommendations:   75 y.o. Hipanic female with hypertension, type 2 DM, hyperlipidemia, mod MR, AI, exertional dyspnea.   Exertional chest pain, dyspnea: Normal coronaries on angiogram,. Normal LVEDP. Stable mild to moderate valvular regurgitations on echocardiogram. Symptoms have improved.   Hypertension:  Controlled.   Hyperlipidemia: Continue Crestor 10 mg daily.  Follow-up after echocardiogram in 1 year.   Nigel Mormon, MD [facility] Pager: [phone] Office: [phone] If no answer Cell [phone]

## 2020-05-31 ENCOUNTER — Ambulatory Visit: Payer: Medicare Other | Admitting: Cardiology
